# Patient Record
Sex: Female | Born: 1990 | Race: Black or African American | Hispanic: No | Marital: Single | Smoking: Former smoker
Health system: Southern US, Community
[De-identification: ages and names within clinical notes are randomized; demographics above are authoritative.]

## PROBLEM LIST (undated history)

## (undated) ENCOUNTER — Inpatient Hospital Stay (HOSPITAL_COMMUNITY): Payer: Self-pay

## (undated) DIAGNOSIS — E282 Polycystic ovarian syndrome: Secondary | ICD-10-CM

## (undated) DIAGNOSIS — R7303 Prediabetes: Secondary | ICD-10-CM

## (undated) DIAGNOSIS — Z3490 Encounter for supervision of normal pregnancy, unspecified, unspecified trimester: Secondary | ICD-10-CM

## (undated) DIAGNOSIS — R87629 Unspecified abnormal cytological findings in specimens from vagina: Secondary | ICD-10-CM

## (undated) HISTORY — DX: Polycystic ovarian syndrome: E28.2

## (undated) HISTORY — DX: Prediabetes: R73.03

## (undated) HISTORY — DX: Unspecified abnormal cytological findings in specimens from vagina: R87.629

## (undated) HISTORY — PX: NO PAST SURGERIES: SHX2092

---
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
## 2011-01-09 ENCOUNTER — Emergency Department: Payer: Self-pay | Admitting: Emergency Medicine

## 2011-05-16 ENCOUNTER — Emergency Department: Payer: Self-pay | Admitting: Emergency Medicine

## 2011-05-16 LAB — URINALYSIS, COMPLETE
Bilirubin,UR: NEGATIVE
Blood: NEGATIVE
Glucose,UR: NEGATIVE mg/dL (ref 0–75)
Ph: 5 (ref 4.5–8.0)
Protein: NEGATIVE
RBC,UR: 1 /HPF (ref 0–5)
Specific Gravity: 1.028 (ref 1.003–1.030)
Squamous Epithelial: 18
WBC UR: 1 /HPF (ref 0–5)

## 2011-05-16 LAB — WET PREP, GENITAL

## 2012-01-31 ENCOUNTER — Emergency Department (HOSPITAL_COMMUNITY)
Admission: EM | Admit: 2012-01-31 | Discharge: 2012-01-31 | Disposition: A | Discharge status: Home / Self Care | Payer: Medicaid Other | Attending: Emergency Medicine | Admitting: Emergency Medicine

## 2012-01-31 ENCOUNTER — Emergency Department (HOSPITAL_COMMUNITY): Payer: Medicaid Other

## 2012-01-31 ENCOUNTER — Encounter (HOSPITAL_COMMUNITY): Payer: Self-pay | Admitting: *Deleted

## 2012-01-31 DIAGNOSIS — Z79899 Other long term (current) drug therapy: Secondary | ICD-10-CM | POA: Insufficient documentation

## 2012-01-31 DIAGNOSIS — N760 Acute vaginitis: Secondary | ICD-10-CM | POA: Insufficient documentation

## 2012-01-31 DIAGNOSIS — R110 Nausea: Secondary | ICD-10-CM | POA: Insufficient documentation

## 2012-01-31 DIAGNOSIS — O99891 Other specified diseases and conditions complicating pregnancy: Secondary | ICD-10-CM | POA: Insufficient documentation

## 2012-01-31 DIAGNOSIS — B9689 Other specified bacterial agents as the cause of diseases classified elsewhere: Secondary | ICD-10-CM

## 2012-01-31 DIAGNOSIS — N83209 Unspecified ovarian cyst, unspecified side: Secondary | ICD-10-CM

## 2012-01-31 DIAGNOSIS — Z87891 Personal history of nicotine dependence: Secondary | ICD-10-CM | POA: Insufficient documentation

## 2012-01-31 DIAGNOSIS — Z3490 Encounter for supervision of normal pregnancy, unspecified, unspecified trimester: Secondary | ICD-10-CM

## 2012-01-31 HISTORY — DX: Encounter for supervision of normal pregnancy, unspecified, unspecified trimester: Z34.90

## 2012-01-31 LAB — URINALYSIS, ROUTINE W REFLEX MICROSCOPIC
Bilirubin Urine: NEGATIVE
Hgb urine dipstick: NEGATIVE
Specific Gravity, Urine: >1.03 — ABNORMAL HIGH (ref 1.005–1.030)
pH: 6 (ref 5.0–8.0)

## 2012-01-31 LAB — WET PREP, GENITAL
Trich, Wet Prep: NONE SEEN
Yeast Wet Prep HPF POC: NONE SEEN

## 2012-01-31 LAB — HCG, QUANTITATIVE, PREGNANCY: hCG, Beta Chain, Quant, S: 98305 m[IU]/mL — ABNORMAL HIGH (ref <5)

## 2012-01-31 MED ORDER — ACETAMINOPHEN 325 MG PO TABS
650.0000 mg | ORAL_TABLET | Freq: Once | ORAL | Status: AC
  Administered 2012-01-31: 650 mg via ORAL
  Filled 2012-01-31: qty 2

## 2012-01-31 MED ORDER — METRONIDAZOLE 500 MG PO TABS: 500.0000 mg | ORAL_TABLET | Freq: Two times a day (BID) | ORAL | Status: DC

## 2012-01-31 NOTE — ED Notes (Addendum)
Pt c/o right lower abd pain that started yesterday, pt woke up this am and advised that the pain is worse, denies any n/v/d, pt is [redacted] weeks pregnant, pregnancy confirmed by ob/gyn in [place], had ultrasound done in office with EDD of 09/10/2012. First pregnancy for pt. Pt states that she has had pain like this before and was told that she had a uti. Pt was sitting in waiting room eating bag of potato chips when called to tx room, pt advised that she is not able to eat or drink until cleared by dr.

## 2012-02-01 LAB — GC/CHLAMYDIA PROBE AMP, GENITAL: GC Probe Amp, Genital: NEGATIVE

## 2012-02-01 NOTE — ED Provider Notes (Signed)
History     CSN: [id number]  Arrival date & time 01/31/12  0900   First MD Initiated Contact with Patient 01/31/12 0915      Chief Complaint  Patient presents with  . Abdominal Pain    (Consider location/radiation/quality/duration/timing/severity/associated sxs/prior treatment) HPI Comments: Tonya Burns presents for evaluation of a 24-hour history of right low were pelvic pain which she described as cramping in character and intermittent.  Patient is currently [redacted] weeks pregnant with her first pregnancy with a confirmed intrauterine pregnancy by her gynecologist at [facility] by ultrasound one week ago.  Mother at bedside notes that there was a benign "nodule" found during this ultrasound at which time she was pain-free.  She denies vaginal bleeding, spotting or discharge and has also had no  vomiting, diarrhea or dysuria.  She does have mild morning nausea but none today, in fact presents here eating a bag of potato chips.  She has taken Tylenol, last dose yesterday evening which helped her cramping moderately.  The history is provided by the patient and a parent.    Past Medical History  Diagnosis Date  . Pregnant     History reviewed. No pertinent past surgical history.  No family history on file.  History  Substance Use Topics  . Smoking status: Former Games developer  . Smokeless tobacco: Not on file  . Alcohol Use: No    OB History    Grav Para Term Preterm Abortions TAB SAB Ect Mult Living   1               Review of Systems  Constitutional: Negative for fever.  HENT: Negative for congestion, sore throat and neck pain.   Eyes: Negative.   Respiratory: Negative for chest tightness and shortness of breath.   Cardiovascular: Negative for chest pain.  Gastrointestinal: Positive for nausea. Negative for vomiting and abdominal pain.  Genitourinary: Positive for pelvic pain. Negative for dysuria, flank pain, vaginal bleeding and vaginal discharge.  Musculoskeletal:  Negative for joint swelling and arthralgias.  Skin: Negative.  Negative for rash and wound.  Neurological: Negative for dizziness, weakness, light-headedness, numbness and headaches.  Hematological: Negative.   Psychiatric/Behavioral: Negative.     Allergies  Review of patient's allergies indicates no known allergies.  Home Medications   Current Outpatient Rx  Name Route Sig Dispense Refill  . PRENATAL MULTIVITAMIN CH Oral Take 1 tablet by mouth daily.    Marland Kitchen METRONIDAZOLE 500 MG PO TABS Oral Take 1 tablet (500 mg total) by mouth 2 (two) times daily. 14 tablet 0    BP 102/65  Pulse 79  Temp 98.5 F (36.9 C)  Resp 20  SpO2 100%  LMP 04/21/2010  Physical Exam  Nursing note and vitals reviewed. Constitutional: She appears well-developed and well-nourished.  HENT:  Head: Normocephalic and atraumatic.  Eyes: Conjunctivae normal are normal.  Neck: Normal range of motion.  Cardiovascular: Normal rate, regular rhythm, normal heart sounds and intact distal pulses.   Pulmonary/Chest: Effort normal and breath sounds normal. She has no wheezes.  Abdominal: Soft. Bowel sounds are normal. She exhibits no distension. There is tenderness in the right lower quadrant. There is no rebound and no guarding.    Genitourinary: Uterus is enlarged. Uterus is not tender. Cervix exhibits no motion tenderness, no discharge and no friability. Right adnexum displays tenderness. No erythema or tenderness around the vagina. Vaginal discharge found.       Nulliparous, closed with mucus plug.  Copious white thick discharge.  Musculoskeletal: Normal range of motion.  Neurological: She is alert.  Skin: Skin is warm and dry.  Psychiatric: She has a normal mood and affect.    ED Course  Procedures (including critical care time)  Labs Reviewed  URINALYSIS, ROUTINE W REFLEX MICROSCOPIC - Abnormal; Notable for the following:    Specific Gravity, Urine >1.030 (*)     All other components within normal limits   PREGNANCY, URINE - Abnormal; Notable for the following:    Preg Test, Ur POSITIVE (*)     All other components within normal limits  HCG, QUANTITATIVE, PREGNANCY - Abnormal; Notable for the following:    hCG, Beta Chain, Quant, S [id number] (*)     All other components within normal limits  WET PREP, GENITAL - Abnormal; Notable for the following:    Clue Cells Wet Prep HPF POC MANY (*)     WBC, Wet Prep HPF POC FEW (*)     All other components within normal limits  GC/CHLAMYDIA PROBE AMP, GENITAL  LAB REPORT - SCANNED   US Ob Comp Less 14 Wks  01/31/2012  *RADIOLOGY REPORT*  Clinical Data: Abdominal and pelvic pain.  Irregular menses. Uncertain LMP.  OBSTETRIC <14 WK [place] AND TRANSVAGINAL OB US  Technique:  Both transabdominal and transvaginal ultrasound examinations were performed for complete evaluation of the gestation as well as the maternal uterus, adnexal regions, and pelvic cul-de-sac.  Transvaginal technique was performed to assess early pregnancy.  Comparison:  None.  Intrauterine gestational sac:  Visualized/normal in shape. Yolk sac: Visualized Embryo: Visualized Cardiac Activity: Visualized Heart Rate: 173  bpm  CRL: 24  mm  9 w  0 d         [place] EDC: 09/04/2012  Maternal uterus/adnexae: The left ovary is normal appearance.  A simple right ovarian cyst is seen measuring 3.5 cm, consistent with a corpus luteum.  No adnexal mass identified.  Trace amount of free fluid seen and pelvic cul-de-sac.  IMPRESSION:  1.  Single living IUP measuring 9 weeks 0 days with [place] EDC of 09/04/2012. 2.  3.5 cm right ovarian corpus luteum cyst.  No adnexal mass identified.   Original Report Authenticated By: Danae Orleans, M.D.    US Ob Transvaginal  01/31/2012  *RADIOLOGY REPORT*  Clinical Data: Abdominal and pelvic pain.  Irregular menses. Uncertain LMP.  OBSTETRIC <14 WK [place] AND TRANSVAGINAL OB US  Technique:  Both transabdominal and transvaginal ultrasound examinations were performed for complete evaluation of  the gestation as well as the maternal uterus, adnexal regions, and pelvic cul-de-sac.  Transvaginal technique was performed to assess early pregnancy.  Comparison:  None.  Intrauterine gestational sac:  Visualized/normal in shape. Yolk sac: Visualized Embryo: Visualized Cardiac Activity: Visualized Heart Rate: 173  bpm  CRL: 24  mm  9 w  0 d         [place] EDC: 09/04/2012  Maternal uterus/adnexae: The left ovary is normal appearance.  A simple right ovarian cyst is seen measuring 3.5 cm, consistent with a corpus luteum.  No adnexal mass identified.  Trace amount of free fluid seen and pelvic cul-de-sac.  IMPRESSION:  1.  Single living IUP measuring 9 weeks 0 days with [place] EDC of 09/04/2012. 2.  3.5 cm right ovarian corpus luteum cyst.  No adnexal mass identified.   Original Report Authenticated By: Danae Orleans, M.D.      1. Ovarian cyst   2. Intrauterine pregnancy   3. Bacterial vaginosis  MDM  Patients labs and/or radiological studies were reviewed during the medical decision making and disposition process.  Patient with ovarian cyst based on today's ultrasound.  Intrauterine pregnancy gestational age [redacted] weeks 0 days.  Fetal heart rate 173.  Discussed various options for pain medication, patient opted to continue with her Tylenol.  She does have an appointment with her OB/GYN in approximately 2 weeks.  She was encouraged to call for earlier followup if her symptoms persist or worsen.  Patient understands and agrees with current plan.         Burgess Amor, [place] 02/01/12 1644

## 2012-02-04 NOTE — ED Provider Notes (Signed)
Medical screening examination/treatment/procedure(s) were performed by non-physician practitioner and as supervising physician I was immediately available for consultation/collaboration.   Laray Anger, DO 02/04/12 1627

## 2012-04-10 NOTE — L&D Delivery Note (Signed)
  Abbi, Mancini [[id number]]  Delivery Note At 12:14 PM a non-viable female was delivered via Vaginal, Spontaneous Delivery (Presentation: Left Occiput Posterior).  APGAR: 2, 1; weight pending.   Placenta status: Intact, Spontaneous.  Cord: 3 vessels with the following complications: None.   Anesthesia: None  Episiotomy: None Lacerations: None Suture Repair: none Est. Blood Loss (mL):   Mom to [facility].  Baby to N/A.  Bonnita Nasuti 04/26/2012, 2:30 PM

## 2012-04-10 NOTE — L&D Delivery Note (Signed)
Attestation of Attending Supervision of Advanced Practitioner (CNM/NP): Evaluation and management procedures were performed by the Advanced Practitioner under my supervision and collaboration.  I have reviewed the Advanced Practitioner's note and chart, and I agree with the management and plan.  HARRAWAY-SMITH, Chantale Leugers 2:32 PM     

## 2012-04-26 ENCOUNTER — Encounter (HOSPITAL_COMMUNITY): Payer: Self-pay | Admitting: Emergency Medicine

## 2012-04-26 ENCOUNTER — Emergency Department (HOSPITAL_COMMUNITY)
Admission: EM | Admit: 2012-04-26 | Discharge: 2012-04-26 | Disposition: A | Discharge status: Home / Self Care | Payer: Medicaid Other | Attending: Emergency Medicine | Admitting: Emergency Medicine

## 2012-04-26 ENCOUNTER — Emergency Department (HOSPITAL_COMMUNITY): Payer: Medicaid Other

## 2012-04-26 ENCOUNTER — Inpatient Hospital Stay (HOSPITAL_COMMUNITY)
Admission: EM | Admit: 2012-04-26 | Discharge: 2012-04-27 | DRG: 775 | Disposition: A | Discharge status: Home / Self Care | Payer: Medicaid Other | Attending: Obstetrics & Gynecology | Admitting: Obstetrics & Gynecology

## 2012-04-26 DIAGNOSIS — R109 Unspecified abdominal pain: Secondary | ICD-10-CM | POA: Insufficient documentation

## 2012-04-26 DIAGNOSIS — O9989 Other specified diseases and conditions complicating pregnancy, childbirth and the puerperium: Secondary | ICD-10-CM | POA: Insufficient documentation

## 2012-04-26 DIAGNOSIS — O3430 Maternal care for cervical incompetence, unspecified trimester: Secondary | ICD-10-CM

## 2012-04-26 DIAGNOSIS — Z87891 Personal history of nicotine dependence: Secondary | ICD-10-CM | POA: Insufficient documentation

## 2012-04-26 DIAGNOSIS — K5900 Constipation, unspecified: Secondary | ICD-10-CM | POA: Insufficient documentation

## 2012-04-26 DIAGNOSIS — N898 Other specified noninflammatory disorders of vagina: Secondary | ICD-10-CM | POA: Insufficient documentation

## 2012-04-26 DIAGNOSIS — O09893 Supervision of other high risk pregnancies, third trimester: Secondary | ICD-10-CM | POA: Diagnosis present

## 2012-04-26 DIAGNOSIS — N939 Abnormal uterine and vaginal bleeding, unspecified: Secondary | ICD-10-CM

## 2012-04-26 LAB — URINALYSIS, ROUTINE W REFLEX MICROSCOPIC
Bilirubin Urine: NEGATIVE
Ketones, ur: NEGATIVE mg/dL
Protein, ur: NEGATIVE mg/dL
Urobilinogen, UA: 0.2 mg/dL (ref 0.0–1.0)

## 2012-04-26 LAB — CBC WITH DIFFERENTIAL/PLATELET
Eosinophils Absolute: 0.1 10*3/uL (ref 0.0–0.7)
Eosinophils Relative: 1 % (ref 0–5)
Lymphs Abs: 2.5 10*3/uL (ref 0.7–4.0)
MCH: 30.2 pg (ref 26.0–34.0)
MCV: 86.8 fL (ref 78.0–100.0)
Monocytes Absolute: 0.9 10*3/uL (ref 0.1–1.0)
Platelets: 189 10*3/uL (ref 150–400)
RBC: 3.71 MIL/uL — ABNORMAL LOW (ref 3.87–5.11)
RDW: 13.4 % (ref 11.5–15.5)

## 2012-04-26 LAB — BASIC METABOLIC PANEL
BUN: 10 mg/dL (ref 6–23)
Calcium: 9.3 mg/dL (ref 8.4–10.5)
Creatinine, Ser: 0.57 mg/dL (ref 0.50–1.10)
GFR calc non Af Amer: >90 mL/min (ref >90)
Glucose, Bld: 85 mg/dL (ref 70–99)
Sodium: 133 mEq/L — ABNORMAL LOW (ref 135–145)

## 2012-04-26 LAB — URINE MICROSCOPIC-ADD ON

## 2012-04-26 MED ORDER — POLYETHYLENE GLYCOL 3350 17 GM/SCOOP PO POWD: 17.0000 g | Freq: Two times a day (BID) | ORAL | Status: DC

## 2012-04-26 MED ORDER — FAMOTIDINE IN NACL 20-0.9 MG/50ML-% IV SOLN: 20.0000 mg | Freq: Once | INTRAVENOUS | Status: DC

## 2012-04-26 MED ORDER — ONDANSETRON HCL 4 MG PO TABS: 4.0000 mg | ORAL_TABLET | ORAL | Status: DC | PRN

## 2012-04-26 MED ORDER — LIDOCAINE HCL (PF) 1 % IJ SOLN: 30.0000 mL | INTRAMUSCULAR | Status: DC | PRN

## 2012-04-26 MED ORDER — OXYTOCIN BOLUS FROM INFUSION
500.0000 mL | INTRAVENOUS | Status: DC
  Administered 2012-04-26: 500 mL via INTRAVENOUS

## 2012-04-26 MED ORDER — TETANUS-DIPHTH-ACELL PERTUSSIS 5-2.5-18.5 LF-MCG/0.5 IM SUSP: 0.5000 mL | Freq: Once | INTRAMUSCULAR | Status: DC

## 2012-04-26 MED ORDER — OXYCODONE-ACETAMINOPHEN 5-325 MG PO TABS
1.0000 | ORAL_TABLET | ORAL | Status: DC | PRN
  Administered 2012-04-26 – 2012-04-27 (×2): 1 via ORAL
  Filled 2012-04-26: qty 1
  Filled 2012-04-26: qty 2

## 2012-04-26 MED ORDER — LACTATED RINGERS IV SOLN: INTRAVENOUS | Status: DC

## 2012-04-26 MED ORDER — SENNOSIDES-DOCUSATE SODIUM 8.6-50 MG PO TABS: 2.0000 | ORAL_TABLET | Freq: Every day | ORAL | Status: DC

## 2012-04-26 MED ORDER — CITRIC ACID-SODIUM CITRATE 334-500 MG/5ML PO SOLN: 30.0000 mL | ORAL | Status: DC | PRN

## 2012-04-26 MED ORDER — IBUPROFEN 600 MG PO TABS: 600.0000 mg | ORAL_TABLET | Freq: Four times a day (QID) | ORAL | Status: DC | PRN

## 2012-04-26 MED ORDER — FENTANYL CITRATE 0.05 MG/ML IJ SOLN
100.0000 ug | Freq: Once | INTRAMUSCULAR | Status: AC
  Administered 2012-04-26: 100 ug via INTRAVENOUS

## 2012-04-26 MED ORDER — LANOLIN HYDROUS EX OINT: TOPICAL_OINTMENT | CUTANEOUS | Status: DC | PRN

## 2012-04-26 MED ORDER — OXYCODONE-ACETAMINOPHEN 5-325 MG PO TABS
2.0000 | ORAL_TABLET | Freq: Once | ORAL | Status: DC
  Filled 2012-04-26: qty 2

## 2012-04-26 MED ORDER — FLEET ENEMA 7-19 GM/118ML RE ENEM: 1.0000 | ENEMA | RECTAL | Status: DC | PRN

## 2012-04-26 MED ORDER — SODIUM CHLORIDE 0.9 % IV BOLUS (SEPSIS)
1000.0000 mL | Freq: Once | INTRAVENOUS | Status: AC
  Administered 2012-04-26: 1000 mL via INTRAVENOUS

## 2012-04-26 MED ORDER — TERBUTALINE SULFATE 1 MG/ML IJ SOLN
INTRAMUSCULAR | Status: AC
  Filled 2012-04-26: qty 1

## 2012-04-26 MED ORDER — BENZOCAINE-MENTHOL 20-0.5 % EX AERO: 1.0000 "application " | INHALATION_SPRAY | CUTANEOUS | Status: DC | PRN

## 2012-04-26 MED ORDER — GI COCKTAIL ~~LOC~~: 30.0000 mL | Freq: Once | ORAL | Status: DC

## 2012-04-26 MED ORDER — WITCH HAZEL-GLYCERIN EX PADS: 1.0000 "application " | MEDICATED_PAD | CUTANEOUS | Status: DC | PRN

## 2012-04-26 MED ORDER — FENTANYL CITRATE 0.05 MG/ML IJ SOLN
INTRAMUSCULAR | Status: AC
  Filled 2012-04-26: qty 2

## 2012-04-26 MED ORDER — ACETAMINOPHEN 325 MG PO TABS: 650.0000 mg | ORAL_TABLET | ORAL | Status: DC | PRN

## 2012-04-26 MED ORDER — PRENATAL MULTIVITAMIN CH
1.0000 | ORAL_TABLET | Freq: Every day | ORAL | Status: DC
  Administered 2012-04-26: 1 via ORAL
  Filled 2012-04-26: qty 1

## 2012-04-26 MED ORDER — MORPHINE SULFATE 4 MG/ML IJ SOLN
4.0000 mg | Freq: Once | INTRAMUSCULAR | Status: AC
  Administered 2012-04-26: 4 mg via INTRAVENOUS
  Filled 2012-04-26: qty 1

## 2012-04-26 MED ORDER — SIMETHICONE 80 MG PO CHEW: 80.0000 mg | CHEWABLE_TABLET | ORAL | Status: DC | PRN

## 2012-04-26 MED ORDER — OXYTOCIN 40 UNITS IN LACTATED RINGERS INFUSION - SIMPLE MED
62.5000 mL/h | INTRAVENOUS | Status: DC
  Administered 2012-04-26: 62.5 mL/h via INTRAVENOUS
  Filled 2012-04-26: qty 1000

## 2012-04-26 MED ORDER — DIBUCAINE 1 % RE OINT: 1.0000 "application " | TOPICAL_OINTMENT | RECTAL | Status: DC | PRN

## 2012-04-26 MED ORDER — TERBUTALINE SULFATE 1 MG/ML IJ SOLN
0.2500 mg | Freq: Once | INTRAMUSCULAR | Status: AC
  Administered 2012-04-26: 0.25 mg via SUBCUTANEOUS

## 2012-04-26 MED ORDER — DOCUSATE SODIUM 100 MG PO CAPS: 100.0000 mg | ORAL_CAPSULE | Freq: Two times a day (BID) | ORAL | Status: DC

## 2012-04-26 MED ORDER — DIPHENHYDRAMINE HCL 25 MG PO CAPS: 25.0000 mg | ORAL_CAPSULE | Freq: Four times a day (QID) | ORAL | Status: DC | PRN

## 2012-04-26 MED ORDER — SODIUM CHLORIDE 0.9 % IV SOLN: INTRAVENOUS | Status: DC

## 2012-04-26 MED ORDER — ONDANSETRON HCL 4 MG/2ML IJ SOLN: 4.0000 mg | Freq: Four times a day (QID) | INTRAMUSCULAR | Status: DC | PRN

## 2012-04-26 MED ORDER — IBUPROFEN 600 MG PO TABS
600.0000 mg | ORAL_TABLET | Freq: Four times a day (QID) | ORAL | Status: DC
  Administered 2012-04-26 – 2012-04-27 (×3): 600 mg via ORAL
  Filled 2012-04-26 (×3): qty 1

## 2012-04-26 MED ORDER — ONDANSETRON HCL 4 MG/2ML IJ SOLN: 4.0000 mg | INTRAMUSCULAR | Status: DC | PRN

## 2012-04-26 MED ORDER — OXYCODONE-ACETAMINOPHEN 5-325 MG PO TABS: 1.0000 | ORAL_TABLET | ORAL | Status: DC | PRN

## 2012-04-26 MED ORDER — ACETAMINOPHEN 500 MG PO TABS
1000.0000 mg | ORAL_TABLET | Freq: Once | ORAL | Status: AC
  Administered 2012-04-26: 1000 mg via ORAL
  Filled 2012-04-26: qty 2

## 2012-04-26 MED ORDER — ZOLPIDEM TARTRATE 5 MG PO TABS: 5.0000 mg | ORAL_TABLET | Freq: Every evening | ORAL | Status: DC | PRN

## 2012-04-26 MED ORDER — MAGNESIUM SULFATE 40 G IN LACTATED RINGERS - SIMPLE
2.0000 g/h | INTRAVENOUS | Status: DC
  Administered 2012-04-26: 2 g/h via INTRAVENOUS
  Filled 2012-04-26: qty 500

## 2012-04-26 MED ORDER — FENTANYL CITRATE 0.05 MG/ML IJ SOLN
INTRAMUSCULAR | Status: AC
  Administered 2012-04-26: 100 ug via INTRAVENOUS
  Filled 2012-04-26: qty 2

## 2012-04-26 MED ORDER — MAGNESIUM SULFATE 40 MG/ML IJ SOLN
4.0000 g | Freq: Once | INTRAMUSCULAR | Status: AC
  Administered 2012-04-26: 4 g via INTRAVENOUS
  Filled 2012-04-26: qty 50

## 2012-04-26 MED ORDER — LACTATED RINGERS IV SOLN: 500.0000 mL | INTRAVENOUS | Status: DC | PRN

## 2012-04-26 MED ORDER — PROMETHAZINE HCL 25 MG/ML IJ SOLN
12.5000 mg | Freq: Once | INTRAMUSCULAR | Status: AC
  Administered 2012-04-26: 12.5 mg via INTRAVENOUS
  Filled 2012-04-26: qty 1

## 2012-04-26 NOTE — ED Notes (Signed)
Report given to [facility] with [facility]. ETA to ED is 30 minutes. Dr Emelda Fear aware.

## 2012-04-26 NOTE — ED Notes (Signed)
Notified of patients arrival at [facility] ed with c/o rectal pain and vaginal bleeding.  EFM with + fht of 158 Assessing for cxt

## 2012-04-26 NOTE — ED Provider Notes (Addendum)
History     CSN: [id number]  Arrival date & time 04/26/12  0103   None     Chief Complaint  Patient presents with  . Abdominal Pain    (Consider location/radiation/quality/duration/timing/severity/associated sxs/prior treatment) HPI Comments: The pt is a 22 y/o female G2P1 who is [redacted] weeks pregnant by US performed at [facility] who presents with lower abdominal pain and lower back pain that is coming in waves and was associated with a small amount of BRB when she wiped after the BM - it started early this evening when she was having a BM, seems to be pressure and a sharp and stabbing pain in the vagina.  Nothing seems to make the pain better or worse - comes on and goes away spontaneously.She has no complications with her pregnancy at this point.  Her first pregnancy ended less than [redacted] weeks along in spontaneous miscarriage.  She was at her OBGYN earlier today for glucose testing.  She has not had pains like this thus far in the pregnancy.  She has no hx of abdominal surgery.  The pt also states that she has had some straining and very hard and rare stools over last few weeks.  No laxatives prior to arrival.  Patient is a 22 y.o. female presenting with abdominal pain. The history is provided by the patient.  Abdominal Pain The primary symptoms of the illness include abdominal pain.    Past Medical History  Diagnosis Date  . Pregnant     History reviewed. No pertinent past surgical history.  History reviewed. No pertinent family history.  History  Substance Use Topics  . Smoking status: Former Games developer  . Smokeless tobacco: Not on file  . Alcohol Use: No    OB History    Grav Para Term Preterm Abortions TAB SAB Ect Mult Living   1               Review of Systems  Gastrointestinal: Positive for abdominal pain.  All other systems reviewed and are negative.    Allergies  Review of patient's allergies indicates no known allergies.  Home Medications   Current Outpatient  Rx  Name  Route  Sig  Dispense  Refill  . DOCUSATE SODIUM 100 MG PO CAPS   Oral   Take 1 capsule (100 mg total) by mouth every 12 (twelve) hours.   30 capsule   0   . METRONIDAZOLE 500 MG PO TABS   Oral   Take 1 tablet (500 mg total) by mouth 2 (two) times daily.   14 tablet   0   . POLYETHYLENE GLYCOL 3350 PO POWD   Oral   Take 17 g by mouth 2 (two) times daily.   255 g   0   . PRENATAL MULTIVITAMIN CH   Oral   Take 1 tablet by mouth daily.           BP 115/63  Pulse 88  Temp 98.6 F (37 C) (Oral)  Resp 18  Ht 5\' 2"  (1.575 m)  Wt 147 lb (66.679 kg)  BMI 26.89 kg/m2  SpO2 100%  LMP 04/21/2010  Physical Exam  Constitutional: She appears well-developed and well-nourished. No distress.  HENT:  Head: Normocephalic and atraumatic.  Mouth/Throat: No oropharyngeal exudate.  Eyes: Conjunctivae normal are normal. Right eye exhibits no discharge. Left eye exhibits no discharge. No scleral icterus.  Cardiovascular: Normal rate, regular rhythm, normal heart sounds and intact distal pulses.   No murmur heard. Pulmonary/Chest:  Effort normal and breath sounds normal. No respiratory distress. She has no wheezes. She has no rales.  Abdominal: Soft. There is no tenderness.       Gravid uterus at level of the umbilicus (just above)  Genitourinary:       Chaperone present for vaginal exam - no bleeding, no d/c, no fluids in vaginal vault  Musculoskeletal: Normal range of motion. She exhibits no edema and no tenderness.  Neurological: She is alert. No cranial nerve deficit.  Skin: Skin is warm and dry. No rash noted. No erythema.    ED Course  Procedures (including critical care time)  Labs Reviewed  CBC WITH DIFFERENTIAL - Abnormal; Notable for the following:    WBC 12.0 (*)     RBC 3.71 (*)     Hemoglobin 11.2 (*)     HCT 32.2 (*)     Neutro Abs 8.5 (*)     All other components within normal limits  BASIC METABOLIC PANEL - Abnormal; Notable for the following:     Sodium 133 (*)     Potassium 3.2 (*)     All other components within normal limits  URINALYSIS, ROUTINE W REFLEX MICROSCOPIC - Abnormal; Notable for the following:    Hgb urine dipstick LARGE (*)     All other components within normal limits  URINE MICROSCOPIC-ADD ON - Abnormal; Notable for the following:    Squamous Epithelial / LPF MANY (*)     Bacteria, UA FEW (*)     All other components within normal limits  URINE CULTURE   No results found.   1. Abdominal pain   2. Constipation   3. Vaginal bleeding       MDM  At this time the pt has fluctuating pains, consider possible premature labor and at this point the baby is non viable.  Will place on toco monitor, VS normal, check UA and labs, pain meds IV.  I recommended to the pt that she had a pelvic exam with a speculum, she declined after discussion regarding its importance - stating that she preferred a female provider - as I was the only provider there, she declined.  She allowed examination of the external genitalia at the introitus with displacing the labia laterally - there was no blood or d/c or fluids seen coming from the vagina.  No contractions seen per [facility] hospital nurse - has improved sx after meds, labs show slight leukocytosis but no other worrisome findings - she has no sig urinary findings - hematuria likely related to her vaginal bleeding.  VS remain normal - I have recommended that the pt f/u with her OBGYN this week.  On repeat exam she has no ttp of the abd.  She will be given laxative for her constipation.  Pt appears stable at this time.  Findings explained to patient - understanding expressed  I personally performed a focused bedside US of the abdomen / good fetal movements, normal FHT's at 150 bpm.  Vida Roller, MD 04/26/12 1610  Vida Roller, MD 04/30/12 [id number]

## 2012-04-26 NOTE — ED Notes (Signed)
Patient left ED at this time with [facility]. 

## 2012-04-26 NOTE — ED Notes (Signed)
Patient placed on appropriate OB monitoring and Rapid response RN called by Diamantina Monks, RN for monitoring.

## 2012-04-26 NOTE — ED Notes (Signed)
Spoke with Dr. Emelda Fear via phone Cervical exam 4 cm with bulging bag Estimated gestation via ultrasound 22 weeks. Patient to be [facility] to [facility] for further evaluation/possible delivery.

## 2012-04-26 NOTE — ED Notes (Signed)
Patient c/o abdominal pain and vaginal pain. Seen last night for same left at 0300 per family. Patient now reporting vaginal bleeding. [redacted] weeks pregnant per chart. Patient moaning.

## 2012-04-26 NOTE — ED Notes (Signed)
PA at bedside.

## 2012-04-26 NOTE — ED Provider Notes (Signed)
Medical screening examination/treatment/procedure(s) were performed by non-physician practitioner and as supervising physician I was immediately available for consultation/collaboration. Devoria Albe, MD, Armando Gang   Ward Givens, MD 04/26/12 [id number]

## 2012-04-26 NOTE — ED Notes (Signed)
Patient will not answer questions.

## 2012-04-26 NOTE — Progress Notes (Signed)
Patient ID: Tonya Burns, female   DOB: 23-Mar-1991, 22 y.o.   MRN: [id number]  Having pain with contractions  FHR doppered 150s UCs every 2-3 mnutes  Cervix Completely dilated, bag protruding to introitus  Abdomen tender  Discussed with Dr Erin Fulling  Will stop Magnesium Sulfate and prepare for delivery  Will culture placenta after delivery

## 2012-04-26 NOTE — H&P (Signed)
Tonya Burns is 22 y.o. G1P0 at at 21+ weeks by patient report, dates based on u/s done at [facility] last week, who presents with preterm labor. She was late to [facility] at 17 weeks, and has since had care at [facility] department. She has been feeling regular contractions since early this morning, and some vaginal bleeding last night. She has not noted any LOF.  She presented to the Emergency room early this morning with complaints of abdominal pain and vaginal bleeding, and no contractions were noted, no blood in the vaginal vault, no dilation, and she was discharged home. A few hours later, she returned to the ED with similar complaints, and was found to be 4 cm dilated with a bulging bag , and was sent to [facility] due to active pre-term labor.    Maternal Medical History:  Reason for admission: Reason for admission: contractions.  Reason for Admission:   nauseaContractions: Frequency: regular.   Perceived severity is strong.    Prenatal complications: Preterm labor.     OB History    Grav Para Term Preterm Abortions TAB SAB Ect Mult Living   1              Past Medical History  Diagnosis Date  . Pregnant    History reviewed. No pertinent past surgical history. Family History: family history is not on file. Social History:  reports that she has quit smoking. She does not have any smokeless tobacco history on file. She reports that she does not drink alcohol or use illicit drugs.   Prenatal Transfer Tool  Maternal Diabetes: One hour GTT 144, three hour not yet done Genetic Screening: Declined Maternal Ultrasounds/Referrals: Normal Fetal Ultrasounds or other Referrals:  None Maternal Substance Abuse:  No Significant Maternal Medications:  None Significant Maternal Lab Results:  None Other Comments:  None  Review of Systems  Constitutional: Negative for fever and chills.  Eyes: Negative for blurred vision and double vision.  Respiratory: Negative for cough.     Cardiovascular: Negative for chest pain.  Gastrointestinal: Positive for abdominal pain. Negative for nausea and vomiting.  Neurological: Negative for headaches.    Dilation: 10 Exam by:: Artelia Laroche, CNM Blood pressure 115/54, pulse 140, temperature 98.6 F (37 C), temperature source Oral, resp. rate 24, height 5\' 2"  (1.575 m), weight 66.679 kg (147 lb), last menstrual period 04/21/2010, SpO2 87.00%. Maternal Exam:  Uterine Assessment: Contraction strength is moderate.  Contraction frequency is regular.   Abdomen: Patient reports generalized tenderness.    Fetal Exam Fetal Monitor Review: Mode: hand-held doppler probe.   Baseline rate: 150.      Physical Exam  Constitutional: She appears well-developed and well-nourished. She appears distressed.  HENT:  Head: Normocephalic and atraumatic.  Eyes: Pupils are equal, round, and reactive to light.  Neck: Normal range of motion. Neck supple.  Cardiovascular: Normal rate, regular rhythm, normal heart sounds and intact distal pulses.   No murmur heard. Respiratory: Effort normal and breath sounds normal. No respiratory distress.  GI: There is generalized tenderness. There is guarding.       Gravid  Musculoskeletal: She exhibits no edema and no tenderness.  Neurological: She is alert.  Skin: Skin is warm and dry.  Psychiatric:       Quiet and appropriate regarding situation    Prenatal labs: ABO, Rh:  A+ Antibody:  neg  Rubella:  imm RPR:   NR HBsAg:   neg HIV:   neg GBS:  unknown  Assessment/Plan: 22 y.o. G1P0 at 21 weeks who presents with preterm labor - admit to L&D - Will stop Magnesium  - fentanyl for pain control - Anticipate SVD   CONROY, LOUISA 04/26/2012, 11:34 AM  Seen and agree with note Patient made aware of advanced dilation and grim prognosis for this probably nonviable baby.  Wynelle Bourgeois CNM

## 2012-04-26 NOTE — ED Provider Notes (Signed)
History     CSN: [id number]  Arrival date & time 04/26/12  8119   First MD Initiated Contact with Patient 04/26/12 [phone]      Chief Complaint  Patient presents with  . Abdominal Pain    (Consider location/radiation/quality/duration/timing/severity/associated sxs/prior treatment) HPI Comments:  22 year old female who is [redacted] weeks pregnant presents to the emergency department complaining of abdominal pain, vaginal pain and back pain beginning yesterday. She was seen last night for the same and discharged around 3 clock this morning. Admits to vaginal bleeding with bright red blood. Exam overnight was unremarkable for any acute problem. There was no vaginal bleeding, discharge or fluids in her vaginal vault at that time. No contractions were seen at that time per nurse at [facility] hospital. She was discharged and given laxatives for constipation. Her pain worsened throughout the morning and is becoming going since. States "I have pressure in my butt". She has a history of a spontaneous abortion in the past at 6 weeks.  The history is provided by the patient. The history is limited by the condition of the patient.    Past Medical History  Diagnosis Date  . Pregnant     History reviewed. No pertinent past surgical history.  No family history on file.  History  Substance Use Topics  . Smoking status: Former Games developer  . Smokeless tobacco: Not on file  . Alcohol Use: No    OB History    Grav Para Term Preterm Abortions TAB SAB Ect Mult Living   1               Review of Systems  Unable to perform ROS: Other  Genitourinary: Positive for vaginal bleeding, vaginal pain and pelvic pain.  Musculoskeletal: Positive for back pain.    Allergies  Review of patient's allergies indicates no known allergies.  Home Medications   Current Outpatient Rx  Name  Route  Sig  Dispense  Refill  . DOCUSATE SODIUM 100 MG PO CAPS   Oral   Take 1 capsule (100 mg total) by mouth every 12 (twelve)  hours.   30 capsule   0   . METRONIDAZOLE 500 MG PO TABS   Oral   Take 1 tablet (500 mg total) by mouth 2 (two) times daily.   14 tablet   0   . POLYETHYLENE GLYCOL 3350 PO POWD   Oral   Take 17 g by mouth 2 (two) times daily.   255 g   0   . PRENATAL MULTIVITAMIN CH   Oral   Take 1 tablet by mouth daily.           BP 95/65  Pulse 95  Temp 99.4 F (37.4 C) (Oral)  Resp 24  Wt 150 lb (68.04 kg)  SpO2 100%  LMP 04/21/2010  Physical Exam  Nursing note and vitals reviewed. Constitutional: She is oriented to person, place, and time. She appears well-developed and well-nourished. She appears distressed.       On fetal monitor.  HENT:  Head: Normocephalic and atraumatic.  Eyes: Conjunctivae normal are normal.  Neck: Normal range of motion. Neck supple.  Cardiovascular: Regular rhythm, normal heart sounds and intact distal pulses.  Tachycardia present.   Pulmonary/Chest: Breath sounds normal. Tachypnea noted. She has no decreased breath sounds.  Abdominal: There is generalized tenderness.  Genitourinary:       Bulging blue sac in vaginal opening.  Musculoskeletal: Normal range of motion.  Neurological: She is alert and oriented to  person, place, and time.  Skin: Skin is warm and dry.  Psychiatric: Her mood appears anxious.    ED Course  Procedures (including critical care time)  Labs Reviewed - No data to display US Ob Limited  04/26/2012  *RADIOLOGY REPORT*  Clinical Data:  Preterm labor  LIMITED OBSTETRIC ULTRASOUND  Number of Fetuses: 1 Heart Rate: 143bpm Movement:  Present Presentation: Cephalic Placental Location: Posterior right lateral Previa: None Amniotic Fluid (Subjective): Normal  BPD:  5.34cm   22w    2d       EDC: 08/29/2012  MATERNAL FINDINGS: Cervix:  Open 3.8 cm with protruding membrane/sac into the vagina Uterus/Adnexae:  Uterine contractions identified.  Limited adnexal assessment.  IMPRESSION: Single live intrauterine gestation measured at 22 weeks  2 days EGA by BPD. Cervix open 3.8 cm diameter with protruding membranes and sac into vagina. Posterior right lateral placenta without previa.  This exam is performed on an emergent basis and does not comprehensively evaluate fetal size, dating, or anatomy, and a follow-up complete OB US should be considered if further fetal assessment is warranted.   Original Report Authenticated By: Ulyses Southward, M.D.      1. Preterm labor       MDM  22 y/o female in pre-term labor. Bedside US shows 4 cm dilation. 22 week pregnancy. Dr. Emelda Fear called in and will ride ambulance with patient down to [facility] for delivery. Preparations for delivery in ED in case delivery takes place prior to being transported. Dr. Lynelle Doctor present.    CRITICAL CARE Performed by: Johnnette Gourd   Total critical care time: 1 hour  Critical care time was exclusive of separately billable procedures and treating other patients.  Critical care was necessary to treat or prevent imminent or life-threatening deterioration.  Critical care was time spent personally by me on the following activities: development of treatment plan with patient and/or surrogate as well as nursing, discussions with consultants, evaluation of patient's response to treatment, examination of patient, obtaining history from patient or surrogate, ordering and performing treatments and interventions, ordering and review of laboratory studies, ordering and review of radiographic studies, pulse oximetry and re-evaluation of patient's condition.        Trevor Mace, PA-C 04/26/12 1016

## 2012-04-26 NOTE — ED Provider Notes (Signed)
21 yr G1 P0 at 21+ weeks by patient report, based on u/s done at [facility] last week, presents with advanced preterm labor with bulging BOW nearly to introitus based on speculum check by Dr Lynelle Doctor, with ultrasound here showing a dilated cervix to 4 cm, with vertex presentation behind cervix, with BPD and FL consistent with 22+ weeks on bedside u/s. Posterior placenta noted. Patient has been given terbutalene 250 mcg, and begun on Mg Sulfate 4 gm load dose in case records, when received indicate pt is possibly viable age.  Labor stabilized.  Will transport via [facility] to [facility].  Dr Erin Fulling informed of pt and condition.

## 2012-04-26 NOTE — ED Notes (Signed)
Patient complaining of lower abdominal pressure and aching lower back pain. Reports has had issues with constipation, was attempting to have bowel movement and noticed some spotting of blood. Also reports having sharp pain in vagina. Reports is [redacted] weeks pregnant.

## 2012-04-26 NOTE — Progress Notes (Signed)
Patient ID: Tonya Burns, female   DOB: 1990/12/04, 22 y.o.   MRN: [id number] 21 you G2P0020 s/p delivery at 21-22 weeks of a nonviable female infant.  Pts spouse was very upset and requested a meeting with the physician.  I met with him.  Present were 2 L&D nurses, the midwife, a PA student and a resident the patient was present and gave me permission to speak to her family which included her spouse, his aunt, her mother and her partners grandmother.  Her partner expressed concerns that the pt delivered and he didn't know that delivery was imminent.   He expressed concerns that the pt was sent home from the prev hosp.  He was concerned about future fertility and prevention of future poor outcomes.  I reviewed with him the information that I received prior to transfer.  I explained that the pt was transfered so that if there was any chance of resuscitation the baby would have the best chance for survival.  I attempted to answer all of his questions to the best of my ability.  I explained to him that the delivery could be the result of infection of cervical insufficiency.  We have obtained cx and will f/u on those. I recommended that her cervical length be checked in any subsequent pregnancy.  They are undecided regarding the desire for further fetal testing.  The spouse left the room and the mother and other family members expressed understanding of the patients current clinical situation.   Cadell Gabrielson L. Harraway-Smith, M.D., Evern Core

## 2012-04-26 NOTE — Progress Notes (Signed)
Pt was resting when I went to check on her and FOB was unavailable.  I will refer to chaplain who is on-call over the weekend, but please page as needs arise.  [employer] Pager, [phone] 4:10 PM   04/26/12 1600  Clinical Encounter Type  Visited With Patient not available;Health care provider  Stress Factors  Patient Stress Factors Loss

## 2012-04-26 NOTE — ED Notes (Signed)
EDD 09/10/12

## 2012-04-26 NOTE — ED Provider Notes (Signed)
Pt is G2P0Ab1 (miscarriage at 6 weeks)  states she is [redacted] weeks pregnant relates she has been having intermittant abdominal cramping since last night.  Pt is crying, in distress. On speculum exam she has a bulging blue sac just inside the vaginal opening.   09:00 Dr Emelda Fear will come see patient, wants bedside US to verify fetal age  Bedside US shows sac in the vagina, head at the cervical opening, cx dilated 4 cm, +FHR 144, BPD gives fetal age of 22 weeks  Preparations made for possible delivery. Delivery tray at bedside. The infant warmer was set up, equipment set up for possible intubation and suction of infant.  09:14 Dr Emelda Fear at bedside  Neonatal team coming to transport patient and possibly fetus. Dr Emelda Fear plans on riding in the ambulance with patient.   Labor/Contractions ceased after getting terbutaline/magnesium. Pt transported by [facility] and the Neonatal ambulance is meeting them and following them to [facility].     Medical screening examination/treatment/procedure(s) were conducted as a shared visit with non-physician practitioner(s) and myself.  I personally evaluated the patient during the encounter  Devoria Albe, MD, Franz Dell, MD 04/26/12 1022

## 2012-04-26 NOTE — Progress Notes (Addendum)
Visit on referral from day time Chaplain. Spoke briefly with Tonya Burns, who said she had cried all day and is tired of crying. She was not interested in talking about her child's death. She watched television to avoid conversation with both Chaplain and her significant other Thereasa Distance). Her declaration that she is tired of crying was clarified to say she is tired of thinking about her son's death. She said this is her first pregnancy and the realization that her child died when all evidence pointed to a successful birth, is overwhelming. She mentioned that her support circle is her mother and relatives.  Thereasa Distance, the significant other is deeply in grief. He questions Tonya Gordy's care in and why Tonya Brackin went into labor so early. He is deeply religious, and when time came to pray he asked for prayers to invoked God to "cure the problem and assist in [place] getting pregnant again." Tonya Carico winced at that suggestion. It is evident that both Tonya Spano and her significant other are so deeply in grief that things told to them about this death have been said but not heard. They are in shock.  Prayer and grief counsel given. Tonya Bedwell confirmed that her negative feelings against medical and spiritual care givers stems from our inability to answer the question, "Why?"  Follow up counsel and referrals are needed.  Chaplains are available 24/7 should follow up be required. Please page if we can assist in any way.   Benjie Karvonen. Kamesha Herne, D.Min., APC Chaplain

## 2012-04-26 NOTE — ED Notes (Signed)
[facility] at bedside for patient transport. Patient belongings given to family member.

## 2012-04-26 NOTE — ED Notes (Signed)
Notified by [facility] Rn that patients cervix is complete with bulging bag. Dr. Emelda Fear notified and in route to [facility] ED

## 2012-04-26 NOTE — Consult Note (Addendum)
Called to evaluate a baby being born estimated to be 21-22 wks. Vaginal delivery. Fetus has very transparent and gelatinous skin, visible veins, very small mouth and fused eyelids. Physical features consistent with 21-[redacted] weeks gestation which is previable.   Weight is pending.  I spoke to parents separately and explained that this is too early for survival and that there is no treatment indicated and offered comfort measures. Mom held the baby.  Tonya Burns Q  BW 394 gms.  Tonya Burns Q

## 2012-04-26 NOTE — ED Notes (Signed)
Report given to [facility]. ETA to ED 20 minutes.

## 2012-04-26 NOTE — ED Notes (Signed)
Dr Emelda Fear at bedside. NICU team requested.

## 2012-04-27 ENCOUNTER — Encounter (HOSPITAL_COMMUNITY): Payer: Self-pay | Admitting: *Deleted

## 2012-04-27 ENCOUNTER — Telehealth (HOSPITAL_COMMUNITY): Payer: Self-pay | Admitting: Emergency Medicine

## 2012-04-27 DIAGNOSIS — O09893 Supervision of other high risk pregnancies, third trimester: Secondary | ICD-10-CM | POA: Diagnosis present

## 2012-04-27 MED ORDER — IBUPROFEN 600 MG PO TABS: 600.0000 mg | ORAL_TABLET | Freq: Four times a day (QID) | ORAL | Status: DC | PRN

## 2012-04-27 NOTE — Progress Notes (Signed)
Discussed with patient and significant other Thereasa Distance) the need for arrangements to care for baby's body.  Patient held no eye contact nor verbalized any opinion.  The significant other Thereasa Distance) stated that we (meaning the hospital) could care of the baby. Explained to patient and significant other the need for arrangements.  The significant other stated that they would just abandon the baby.  Dr. Emelda Fear made aware and in to talk with patient and significant other.

## 2012-04-27 NOTE — Progress Notes (Signed)
Patient Verbalized to RN that the funeral home will be [facility] and that she had spoke with them and had released the baby's body to  [employer].  Discharge instructions reviewed with patient and significant other.  Discussed activity, signs/symptoms to report to MD or seek medical help, community resources, depression and follow up visit with MD.  Ambulated to car with staff without incident and discharged home with family.  Patient and significant other stated understanding of home care.

## 2012-04-27 NOTE — Discharge Summary (Addendum)
Obstetric Discharge Summary Reason for Admission: Preterm labor at 22 weeks with advanced cervical dilation Prenatal Procedures: none Intrapartum Procedures: spontaneous vaginal delivery Postpartum Procedures: none Complications-Operative and Postpartum: none Hemoglobin  Date Value Range Status  04/26/2012 11.2* 12.0 - 15.0 g/dL Final     HCT  Date Value Range Status  04/26/2012 32.2* 36.0 - 46.0 % Final    Physical Exam:  General: alert, cooperative and no distress Lochia: appropriate Uterine Fundus: firm Incision: n/a DVT Evaluation: No evidence of DVT seen on physical exam. Negative Homan's sign.  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 Schedule an appointment as soon as possible for a visit in 2 weeks.   Contact information:   [address] [place] [address] [phone]          Newborn Data:   Summerlyn, Fickel [[id number]]  Live born female  Birth Weight: 13.9 oz (394 g) APGAR: 2, 1   Baili, Stang [[id number]]  Live born female  Birth Weight:  APGAR: ,    22 week infant born with demise shortly after delivery.. Pt will need early prenatal care with next pregnancy Patient is a candidate for 17-P Possible candidate for cerclage, but will definitely need cervical length assessment in early 2nd trimester.  Hermenia Fritcher H. 04/27/2012, 10:36 AM

## 2012-04-27 NOTE — Progress Notes (Signed)
Provided patient with funeral home list.  Social worker unavailable until 1230 p.m.Tonya Burns  Patient declined to see SW.  Patient receptive of funeral home list.

## 2012-04-29 LAB — WOUND CULTURE: Culture: NO GROWTH

## 2012-04-29 LAB — URINE CULTURE: Colony Count: 45000

## 2012-04-29 MED FILL — Medication: Qty: 1 | Status: AC

## 2012-04-29 NOTE — H&P (Signed)
Attestation of Attending Supervision of Advanced Practitioner: Evaluation and management procedures were performed by the PA/NP/CNM/OB Fellow under my supervision/collaboration. Chart reviewed and agree with management and plan. I saw the patient at [facility] ED and coordinated transfer to [facility] hospital for delivery.  Teran Daughenbaugh V 04/29/2012 10:42 PM

## 2012-04-29 NOTE — Progress Notes (Signed)
Post discharge ur chart review completed. 

## 2012-04-30 ENCOUNTER — Telehealth (HOSPITAL_COMMUNITY): Payer: Self-pay | Admitting: Emergency Medicine

## 2012-04-30 LAB — WOUND CULTURE: Gram Stain: NONE SEEN

## 2012-04-30 NOTE — ED Notes (Signed)
+   Urine Chart sent to EDP office for review. 

## 2012-05-01 LAB — ANAEROBIC CULTURE
Special Requests: NORMAL
Special Requests: NORMAL

## 2012-05-01 NOTE — ED Notes (Signed)
Chart sent to EDP office for review. Rx for Keflex 500 BID x 7 days written by Jonathon Jordan needs to be called to pharmacy.

## 2012-05-04 ENCOUNTER — Telehealth (HOSPITAL_COMMUNITY): Payer: Self-pay | Admitting: Emergency Medicine

## 2012-05-05 NOTE — ED Notes (Signed)
Unable to contact patient via phone. Sent letter. °

## 2012-05-31 ENCOUNTER — Emergency Department: Payer: Self-pay | Admitting: Emergency Medicine

## 2012-05-31 LAB — URINALYSIS, COMPLETE
Bacteria: NONE SEEN
Bilirubin,UR: NEGATIVE
Glucose,UR: NEGATIVE mg/dL (ref 0–75)
Nitrite: NEGATIVE
Ph: 5 (ref 4.5–8.0)
WBC UR: NONE SEEN /HPF (ref 0–5)

## 2012-05-31 LAB — CBC
HCT: 36 % (ref 35.0–47.0)
MCV: 90 fL (ref 80–100)
Platelet: 185 10*3/uL (ref 150–440)
RBC: 4 10*6/uL (ref 3.80–5.20)
WBC: 9.9 10*3/uL (ref 3.6–11.0)

## 2012-06-04 LAB — PATHOLOGY REPORT

## 2012-06-30 ENCOUNTER — Telehealth (HOSPITAL_COMMUNITY): Payer: Self-pay | Admitting: Emergency Medicine

## 2012-06-30 NOTE — ED Notes (Signed)
No response to letter sent after 30 days. Chart sent to Medical Records. °

## 2012-09-04 ENCOUNTER — Encounter (HOSPITAL_COMMUNITY): Payer: Self-pay | Admitting: Emergency Medicine

## 2012-09-04 ENCOUNTER — Emergency Department (HOSPITAL_COMMUNITY)
Admission: EM | Admit: 2012-09-04 | Discharge: 2012-09-04 | Disposition: A | Discharge status: Home / Self Care | Payer: Medicaid Other | Attending: Emergency Medicine | Admitting: Emergency Medicine

## 2012-09-04 DIAGNOSIS — Z79899 Other long term (current) drug therapy: Secondary | ICD-10-CM | POA: Insufficient documentation

## 2012-09-04 DIAGNOSIS — Z87891 Personal history of nicotine dependence: Secondary | ICD-10-CM | POA: Insufficient documentation

## 2012-09-04 DIAGNOSIS — Z331 Pregnant state, incidental: Secondary | ICD-10-CM | POA: Insufficient documentation

## 2012-09-04 DIAGNOSIS — J069 Acute upper respiratory infection, unspecified: Secondary | ICD-10-CM | POA: Insufficient documentation

## 2012-09-04 DIAGNOSIS — J3489 Other specified disorders of nose and nasal sinuses: Secondary | ICD-10-CM | POA: Insufficient documentation

## 2012-09-04 DIAGNOSIS — J029 Acute pharyngitis, unspecified: Secondary | ICD-10-CM | POA: Insufficient documentation

## 2012-09-04 MED ORDER — MAGIC MOUTHWASH W/LIDOCAINE: 5.0000 mL | Freq: Three times a day (TID) | ORAL | Status: DC | PRN

## 2012-09-04 MED ORDER — PSEUDOEPHEDRINE HCL 60 MG PO TABS: 60.0000 mg | ORAL_TABLET | Freq: Four times a day (QID) | ORAL | Status: DC | PRN

## 2012-09-04 MED ORDER — IBUPROFEN 600 MG PO TABS: 600.0000 mg | ORAL_TABLET | Freq: Four times a day (QID) | ORAL | Status: DC | PRN

## 2012-09-04 NOTE — ED Notes (Signed)
Pt c/o fever/ sore throat since yesterday.

## 2012-09-08 NOTE — ED Provider Notes (Signed)
History     CSN: [id number]  Arrival date & time 09/04/12  0900   First MD Initiated Contact with Patient 09/04/12 [phone]      Chief Complaint  Patient presents with  . Fever  . Sore Throat    (Consider location/radiation/quality/duration/timing/severity/associated sxs/prior treatment) Patient is a 22 y.o. female presenting with fever. The history is provided by the patient and a parent.  Fever Temp source:  Subjective Onset quality:  Gradual Duration:  1 day Timing:  Constant Progression:  Unchanged Chronicity:  New Relieved by:  Nothing Worsened by:  Nothing tried Ineffective treatments: OTC cold medication. Associated symptoms: congestion, rhinorrhea and sore throat   Associated symptoms: no chills, no confusion, no cough, no diarrhea, no dysuria, no ear pain, no headaches, no nausea, no rash and no vomiting     Past Medical History  Diagnosis Date  . Pregnant     History reviewed. No pertinent past surgical history.  No family history on file.  History  Substance Use Topics  . Smoking status: Former Games developer  . Smokeless tobacco: Not on file  . Alcohol Use: No    OB History   Grav Para Term Preterm Abortions TAB SAB Ect Mult Living   1 1  1     1 1       Review of Systems  Constitutional: Positive for fever. Negative for chills, activity change and appetite change.  HENT: Positive for congestion, sore throat and rhinorrhea. Negative for ear pain, facial swelling, trouble swallowing, neck pain, neck stiffness and voice change.   Eyes: Negative for visual disturbance.  Respiratory: Negative for cough, chest tightness, shortness of breath, wheezing and stridor.   Gastrointestinal: Negative for nausea, vomiting, abdominal pain and diarrhea.  Genitourinary: Negative for dysuria.  Skin: Negative for rash and wound.  Neurological: Negative for dizziness, weakness, numbness and headaches.  Hematological: Negative for adenopathy.  Psychiatric/Behavioral: Negative  for confusion.  All other systems reviewed and are negative.    Allergies  Tomato  Home Medications   Current Outpatient Rx  Name  Route  Sig  Dispense  Refill  . Phenylephrine-DM-GG-APAP (COLD & FLU SEVERE PO)   Oral   Take 2 tablets by mouth every 6 (six) hours as needed. Cold and flu         . Alum & Mag Hydroxide-Simeth (MAGIC MOUTHWASH W/LIDOCAINE) SOLN   Oral   Take 5 mLs by mouth 3 (three) times daily as needed. Swish and spit , do not swallow   100 mL   0   . ibuprofen (ADVIL,MOTRIN) 600 MG tablet   Oral   Take 1 tablet (600 mg total) by mouth every 6 (six) hours as needed for pain.   20 tablet   0   . pseudoephedrine (SUDAFED) 60 MG tablet   Oral   Take 1 tablet (60 mg total) by mouth every 6 (six) hours as needed for congestion.   20 tablet   0     BP 116/66  Pulse 91  Temp(Src) 99.2 F (37.3 C)  Resp 18  Ht 5\' 2"  (1.575 m)  Wt 130 lb (58.968 kg)  BMI 23.77 kg/m2  SpO2 100%  LMP 07/24/2012  Physical Exam  Nursing note and vitals reviewed. Constitutional: She is oriented to person, place, and time. She appears well-developed and well-nourished. No distress.  HENT:  Head: Normocephalic and atraumatic. No trismus in the jaw.  Right Ear: Tympanic membrane and ear canal normal. No mastoid tenderness. Tympanic membrane is  not erythematous and not bulging. No hemotympanum.  Left Ear: Tympanic membrane and ear canal normal. No mastoid tenderness. Tympanic membrane is not erythematous and not bulging. No hemotympanum.  Nose: Mucosal edema present. No rhinorrhea.  Mouth/Throat: Uvula is midline and mucous membranes are normal. No edematous. Posterior oropharyngeal erythema present. No oropharyngeal exudate, posterior oropharyngeal edema or tonsillar abscesses.  Eyes: Conjunctivae and EOM are normal. Pupils are equal, round, and reactive to light.  Neck: Normal range of motion and phonation normal. Neck supple. No Brudzinski's sign and no Kernig's sign noted.   Cardiovascular: Normal rate, regular rhythm, normal heart sounds and intact distal pulses.   No murmur heard. Pulmonary/Chest: Effort normal and breath sounds normal. No respiratory distress. She has no wheezes. She has no rales.  Abdominal: Soft. Bowel sounds are normal. She exhibits no distension. There is no tenderness. There is no rebound and no guarding.  Musculoskeletal: She exhibits no edema.  Lymphadenopathy:    She has no cervical adenopathy.  Neurological: She is alert and oriented to person, place, and time. She exhibits normal muscle tone. Coordination normal.  Skin: Skin is warm and dry.    ED Course  Procedures (including critical care time)  Labs Reviewed - No data to display No results found.   1. Pharyngitis   2. URI (upper respiratory infection)       MDM    Patient is alert, mucous membranes are moist.  No PTA, exudates or cervical lymphadenopathy. Doubt strep, likely viral illness.  Will treat symptomatically and mother agrees to return here if sx's worsen.  She is well appearing and stable for d/c      Margree Gimbel L. Maybel Dambrosio, PA-C 09/08/12 1423

## 2012-09-09 NOTE — ED Provider Notes (Signed)
Medical screening examination/treatment/procedure(s) were performed by non-physician practitioner and as supervising physician I was immediately available for consultation/collaboration.  Matisyn Cabeza, MD 09/09/12 1418 

## 2012-10-31 ENCOUNTER — Encounter: Payer: Self-pay | Admitting: Obstetrics and Gynecology

## 2012-10-31 ENCOUNTER — Ambulatory Visit (INDEPENDENT_AMBULATORY_CARE_PROVIDER_SITE_OTHER): Payer: BC Managed Care – PPO | Admitting: Obstetrics and Gynecology

## 2012-10-31 VITALS — BP 102/50 | Ht 62.0 in | Wt 129.4 lb

## 2012-10-31 DIAGNOSIS — N970 Female infertility associated with anovulation: Secondary | ICD-10-CM

## 2012-10-31 NOTE — Patient Instructions (Signed)
USE FErtility apps plus website called "my [email or web address]", also visibly [facility] for  reproductive medicine website, and [email or web address] for fertility information we'll check blood work today and repeat progesterone level on day 18 the next cycle, August 11

## 2012-10-31 NOTE — Progress Notes (Signed)
   [facility] Visit  Patient name: Tonya Burns MRN [id number]  Date of birth: 1991/03/08  CC & HPI:  Tonya Burns is a 22 y.o. female presenting today for fertility concerns. She is a gravida 1 para 0100 status post preterm delivery at [redacted] weeks gestation suspected due to cervical incompetency ROS:  Is only had one period since her pregnancy loss, in June 13 lasting 5 days  Pertinent History Reviewed:  Medical & Surgical Hx:  Reviewed: Significant for  Medications: Reviewed & Updated - see associated section Social History: Reviewed -  reports that she has quit smoking. She does not have any smokeless tobacco history on file.  Objective Findings:  Vitals: BP 102/50  Ht 5\' 2"  (1.575 m)  Wt 129 lb 6.4 oz (58.695 kg)  BMI 23.66 kg/m2  LMP 09/20/2012  Physical Examination: General appearance - alert, well appearing, and in no distress and normal appearing weight Mental status - alert, oriented to person, place, and time, normal mood, behavior, speech, dress, motor activity, and thought processes Abdomen - soft, nontender, nondistended, no masses or organomegaly Pelvic - normal external genitalia, vulva, vagina, cervix, uterus and adnexa,  CERVICAL MUCUS ON FERN TESTING: FERN POSITIVE.(ANOVULATORY)    Assessment & Plan:   ANOVULATION   CHECK tsh, BEGIN CLOMIPHENE 50 MG X 5 DAYS, CHECK FSH, TSH TODAY CHECK PROGESTERONE LEVEL, ON DAY 18 AUG 11

## 2012-11-01 ENCOUNTER — Telehealth: Payer: Self-pay | Admitting: Obstetrics and Gynecology

## 2012-11-01 ENCOUNTER — Other Ambulatory Visit: Payer: Self-pay | Admitting: Obstetrics and Gynecology

## 2012-11-01 DIAGNOSIS — N970 Female infertility associated with anovulation: Secondary | ICD-10-CM

## 2012-11-01 LAB — COMPREHENSIVE METABOLIC PANEL
Albumin: 4.4 g/dL (ref 3.5–5.2)
CO2: 25 mEq/L (ref 19–32)
Calcium: 9.1 mg/dL (ref 8.4–10.5)
Chloride: 107 mEq/L (ref 96–112)
Glucose, Bld: 74 mg/dL (ref 70–99)
Potassium: 3.9 mEq/L (ref 3.5–5.3)
Sodium: 139 mEq/L (ref 135–145)
Total Bilirubin: 0.9 mg/dL (ref 0.3–1.2)
Total Protein: 7.2 g/dL (ref 6.0–8.3)

## 2012-11-01 MED ORDER — CLOMIPHENE CITRATE 50 MG PO TABS: 50.0000 mg | ORAL_TABLET | Freq: Every day | ORAL | Status: DC

## 2012-11-01 NOTE — Telephone Encounter (Signed)
Pt stated Dr. Emelda Fear was supposed to send a Rx to the [facility] in [place] but it wasn't called in. I spoke with the pt and she stated it was supposed to be 50 mg of something for infertility.

## 2012-11-03 NOTE — Telephone Encounter (Signed)
Rx for Clomid e-scribed to [facility] on 7/25

## 2012-11-22 ENCOUNTER — Encounter: Payer: Self-pay | Admitting: Obstetrics and Gynecology

## 2012-11-22 ENCOUNTER — Ambulatory Visit (INDEPENDENT_AMBULATORY_CARE_PROVIDER_SITE_OTHER): Payer: BC Managed Care – PPO | Admitting: Obstetrics and Gynecology

## 2012-11-22 VITALS — BP 110/62 | Ht 62.0 in | Wt 129.0 lb

## 2012-11-22 DIAGNOSIS — Z3200 Encounter for pregnancy test, result unknown: Secondary | ICD-10-CM

## 2012-11-22 DIAGNOSIS — N970 Female infertility associated with anovulation: Secondary | ICD-10-CM

## 2012-11-22 DIAGNOSIS — Z3202 Encounter for pregnancy test, result negative: Secondary | ICD-10-CM

## 2012-11-22 LAB — POCT URINE PREGNANCY: Preg Test, Ur: NEGATIVE

## 2012-11-22 MED ORDER — CLOMIPHENE CITRATE 50 MG PO TABS: 50.0000 mg | ORAL_TABLET | Freq: Every day | ORAL | Status: DC

## 2012-11-22 NOTE — Progress Notes (Signed)
   [facility] Visit  Patient name: Tonya Burns MRN [id number]  Date of birth: December 14, 1990  CC & HPI:  Tonya Burns is a 22 y.o. female presenting today for followup anovulation  ROS:  Patient took Clomid 50 mg x5 days earlier this month. She did not have progesterone level drawn 811 due to miscommunication  Pertinent History Reviewed:  Medical & Surgical Hx:  Reviewed: Significant for history of cervical incompetence with pregnancy loss at 22 weeks Medications: Reviewed & Updated - see associated section Social History: Reviewed -  reports that she has quit smoking. She has never used smokeless tobacco.  Objective Findings:  Vitals: BP 110/62  Ht 5\' 2"  (1.575 m)  Wt 129 lb (58.514 kg)  BMI 23.59 kg/m2  LMP 09/20/2012  Breastfeeding? No  Physical Examination: General appearance - alert, well appearing, and in no distress Abdomen - soft, nontender, nondistended, no masses or organomegaly Pelvic - VULVA: normal appearing vulva with no masses, tenderness or lesions, VAGINA: normal appearing vagina with normal color and discharge, no lesions, cervical mucus white, thick not anovulatory, CERVIX: normal appearing cervix without discharge or lesions, cervical motion tenderness absent   Assessment & Plan:   Probable ovulation status post Clomid use   Cross her progesterone level today followup by phone, will increase or maintain Clomid dose  based on results

## 2012-11-23 LAB — PROGESTERONE: Progesterone: 43.8 ng/mL

## 2012-12-11 ENCOUNTER — Ambulatory Visit: Payer: BC Managed Care – PPO | Admitting: Obstetrics and Gynecology

## 2012-12-23 ENCOUNTER — Ambulatory Visit (INDEPENDENT_AMBULATORY_CARE_PROVIDER_SITE_OTHER): Payer: BC Managed Care – PPO

## 2012-12-23 ENCOUNTER — Other Ambulatory Visit: Payer: Self-pay | Admitting: Obstetrics & Gynecology

## 2012-12-23 DIAGNOSIS — O09299 Supervision of pregnancy with other poor reproductive or obstetric history, unspecified trimester: Secondary | ICD-10-CM

## 2012-12-23 DIAGNOSIS — O3680X0 Pregnancy with inconclusive fetal viability, not applicable or unspecified: Secondary | ICD-10-CM

## 2012-12-23 NOTE — Progress Notes (Signed)
U/S-single IUP with +FCA noted, CRL c/w 8+0wks, EDD 08/04/2013, cx long and closed, C.L. Noted on Rt = 4.5x3.7cm, no free fluid noted within pelvis

## 2012-12-26 ENCOUNTER — Encounter: Payer: Self-pay | Admitting: Advanced Practice Midwife

## 2012-12-26 ENCOUNTER — Telehealth: Payer: Self-pay | Admitting: Adult Health

## 2012-12-26 ENCOUNTER — Ambulatory Visit (INDEPENDENT_AMBULATORY_CARE_PROVIDER_SITE_OTHER): Payer: BC Managed Care – PPO | Admitting: Advanced Practice Midwife

## 2012-12-26 VITALS — BP 100/40 | Wt 133.5 lb

## 2012-12-26 DIAGNOSIS — Z1389 Encounter for screening for other disorder: Secondary | ICD-10-CM

## 2012-12-26 DIAGNOSIS — O2390 Unspecified genitourinary tract infection in pregnancy, unspecified trimester: Secondary | ICD-10-CM

## 2012-12-26 DIAGNOSIS — Z331 Pregnant state, incidental: Secondary | ICD-10-CM

## 2012-12-26 DIAGNOSIS — O09291 Supervision of pregnancy with other poor reproductive or obstetric history, first trimester: Secondary | ICD-10-CM

## 2012-12-26 LAB — POCT URINALYSIS DIPSTICK
Blood, UA: NEGATIVE
Nitrite, UA: NEGATIVE
Protein, UA: NEGATIVE

## 2012-12-26 NOTE — Progress Notes (Signed)
Has right ov cyst.  Had a pain in her right side, lifted a heavy box at work, and noticed a spot of bright red blood when she went to bathroom.  SSE:  No blood at all, + yeast. OTC vaginal meds recommended and note for work no lifting > 25 lbs

## 2012-12-26 NOTE — Progress Notes (Signed)
Pain in right side. Noticed bright red spotting today when she wiped.

## 2012-12-26 NOTE — Telephone Encounter (Signed)
Pt states lifted boxes at work that was greater than 50 lbs. Has had spotting and pain in lower abdomen since lifted box. Pt states has an hx of "weak cervics" Pt states employer told her she would need a note stating her restrictions for work or she would not be able to work. Call transferred to front staff for pt to be seen today for spotting and pain.

## 2012-12-30 ENCOUNTER — Encounter: Payer: Self-pay | Admitting: Adult Health

## 2012-12-30 ENCOUNTER — Ambulatory Visit (INDEPENDENT_AMBULATORY_CARE_PROVIDER_SITE_OTHER): Payer: BC Managed Care – PPO | Admitting: Adult Health

## 2012-12-30 ENCOUNTER — Other Ambulatory Visit (HOSPITAL_COMMUNITY)
Admission: RE | Admit: 2012-12-30 | Discharge: 2012-12-30 | Disposition: A | Discharge status: Home / Self Care | Payer: Medicaid Other | Source: Ambulatory Visit | Attending: Adult Health | Admitting: Adult Health

## 2012-12-30 VITALS — BP 96/60 | Wt 133.0 lb

## 2012-12-30 DIAGNOSIS — Z1389 Encounter for screening for other disorder: Secondary | ICD-10-CM

## 2012-12-30 DIAGNOSIS — O09299 Supervision of pregnancy with other poor reproductive or obstetric history, unspecified trimester: Secondary | ICD-10-CM

## 2012-12-30 DIAGNOSIS — O09219 Supervision of pregnancy with history of pre-term labor, unspecified trimester: Secondary | ICD-10-CM

## 2012-12-30 DIAGNOSIS — O09291 Supervision of pregnancy with other poor reproductive or obstetric history, first trimester: Secondary | ICD-10-CM

## 2012-12-30 DIAGNOSIS — Z3490 Encounter for supervision of normal pregnancy, unspecified, unspecified trimester: Secondary | ICD-10-CM

## 2012-12-30 DIAGNOSIS — Z3481 Encounter for supervision of other normal pregnancy, first trimester: Secondary | ICD-10-CM

## 2012-12-30 DIAGNOSIS — Z113 Encounter for screening for infections with a predominantly sexual mode of transmission: Secondary | ICD-10-CM | POA: Insufficient documentation

## 2012-12-30 DIAGNOSIS — O99019 Anemia complicating pregnancy, unspecified trimester: Secondary | ICD-10-CM

## 2012-12-30 DIAGNOSIS — Z01419 Encounter for gynecological examination (general) (routine) without abnormal findings: Secondary | ICD-10-CM | POA: Insufficient documentation

## 2012-12-30 HISTORY — DX: Encounter for supervision of normal pregnancy, unspecified, unspecified trimester: Z34.90

## 2012-12-30 LAB — CBC
Hemoglobin: 11.8 g/dL — ABNORMAL LOW (ref 12.0–15.0)
Platelets: 255 10*3/uL (ref 150–400)
RBC: 4.21 MIL/uL (ref 3.87–5.11)

## 2012-12-30 LAB — POCT URINALYSIS DIPSTICK
Glucose, UA: NEGATIVE
Ketones, UA: NEGATIVE
Leukocytes, UA: NEGATIVE
Nitrite, UA: NEGATIVE

## 2012-12-30 NOTE — Progress Notes (Signed)
  Subjective:    Tonya Burns is a 22 y.o. G23P0100 African American female at [redacted]w[redacted]d by [place] being seen today for her first obstetrical visit.  Her obstetrical history is significant for incompent cervix with pregnancy loss in second trimester.  Pregnancy history fully reviewed.   Patient reports constipation.Try senokot and prune juice.  Filed Vitals:   12/30/12 1156  BP: 96/60  Weight: 133 lb (60.328 kg)    HISTORY: OB History  Gravida Para Term Preterm AB SAB TAB Ectopic Multiple Living  2 1  1     1  0    # Outcome Date GA Lbr Len/2nd Weight Sex Delivery Anes PTL Lv  2 CUR           1 PRE 04/26/12 [redacted]w[redacted]d / 01:03 13.9 oz (0.394 kg) M SVD None Y ND     Past Medical History  Diagnosis Date  . Pregnant   . Pregnant 12/30/2012   History reviewed. No pertinent past surgical history. Family History  Problem Relation Age of Onset  . Hypertension Father      Exam    Pelvic Exam:    Perineum: Normal Perineum   Vulva: normal   Vagina:  normal mucosa, normal discharge, no palpable nodules   Uterus   9 week size     Cervix: normal   Adnexa: Not palpable   Urinary: urethral meatus normal    System:     Skin: normal coloration and turgor, no rashes    Neurologic: oriented, normal mood   Extremities: normal strength, tone, and muscle mass   HEENT PERRLA   Mouth/Teeth mucous membranes moist   Cardiovascular: regular rate and rhythm   Respiratory:  appears well, vitals normal, no respiratory distress, acyanotic, normal RR   Abdomen: soft, non-tender    Thin prep pap smear done.   Assessment:    Pregnancy: G2P0100 Patient Active Problem List   Diagnosis Date Noted  . Pregnant 12/30/2012  . H/O incompetent cervix, currently pregnant 12/26/2012  . Infertility, anovulation 10/31/2012  . Preterm delivery, delivered 04/27/2012      [redacted]w[redacted]d G2P0100 New OB visit    Plan:     Initial labs drawn Continue prenatal vitamins Problem list reviewed and updated Reviewed  recommended weight gain based on pre-gravid BMI Encouraged well-balanced diet Genetic Screening discussed Integrated Screen: requested Cystic fibrosis screening discussed declined Ultrasound discussed; fetal survey: requested Follow up in 3 weeks for IN/NT and see Dr Despina Hidden to discuss cerclage  Anahi Belmar 12/30/2012 2:18 PM

## 2012-12-30 NOTE — Patient Instructions (Addendum)
Constipation, Adult Constipation is when a person has fewer than 3 bowel movements a week; has difficulty having a bowel movement; or has stools that are dry, hard, or larger than normal. As people grow older, constipation is more common. If you try to fix constipation with medicines that make you have a bowel movement (laxatives), the problem may get worse. Long-term laxative use may cause the muscles of the colon to become weak. A low-fiber diet, not taking in enough fluids, and taking certain medicines may make constipation worse. CAUSES   Certain medicines, such as antidepressants, pain medicine, iron supplements, antacids, and water pills.   Certain diseases, such as diabetes, irritable bowel syndrome (IBS), thyroid disease, or depression.   Not drinking enough water.   Not eating enough fiber-rich foods.   Stress or travel.  Lack of physical activity or exercise.  Not going to the restroom when there is the urge to have a bowel movement.  Ignoring the urge to have a bowel movement.  Using laxatives too much. SYMPTOMS   Having fewer than 3 bowel movements a week.   Straining to have a bowel movement.   Having hard, dry, or larger than normal stools.   Feeling full or bloated.   Pain in the lower abdomen.  Not feeling relief after having a bowel movement. DIAGNOSIS  Your caregiver will take a medical history and perform a physical exam. Further testing may be done for severe constipation. Some tests may include:   A barium enema X-ray to examine your rectum, colon, and sometimes, your small intestine.  A sigmoidoscopy to examine your lower colon.  A colonoscopy to examine your entire colon. TREATMENT  Treatment will depend on the severity of your constipation and what is causing it. Some dietary treatments include drinking more fluids and eating more fiber-rich foods. Lifestyle treatments may include regular exercise. If these diet and lifestyle recommendations  do not help, your caregiver may recommend taking over-the-counter laxative medicines to help you have bowel movements. Prescription medicines may be prescribed if over-the-counter medicines do not work.  HOME CARE INSTRUCTIONS   Increase dietary fiber in your diet, such as fruits, vegetables, whole grains, and beans. Limit high-fat and processed sugars in your diet, such as [place] fries, hamburgers, cookies, candies, and soda.   A fiber supplement may be added to your diet if you cannot get enough fiber from foods.   Drink enough fluids to keep your urine clear or pale yellow.   Exercise regularly or as directed by your caregiver.   Go to the restroom when you have the urge to go. Do not hold it.  Only take medicines as directed by your caregiver. Do not take other medicines for constipation without talking to your caregiver first. SEEK IMMEDIATE MEDICAL CARE IF:   You have bright red blood in your stool.   Your constipation lasts for more than 4 days or gets worse.   You have abdominal or rectal pain.   You have thin, pencil-like stools.  You have unexplained weight loss. MAKE SURE YOU:   Understand these instructions.  Will watch your condition.  Will get help right away if you are not doing well or get worse. Document Released: 12/24/2003 Document Revised: 06/19/2011 Document Reviewed: 02/28/2011 [facility] Patient Information 2014 [place], [place]. Pregnancy - First Trimester During sexual intercourse, millions of sperm go into the vagina. Only 1 sperm will penetrate and fertilize the female egg while it is in the Fallopian tube. One week later, the fertilized  egg implants into the wall of the uterus. An embryo begins to develop into a baby. At 6 to 8 weeks, the eyes and face are formed and the heartbeat can be seen on ultrasound. At the end of 12 weeks (first trimester), all the baby's organs are formed. Now that you are pregnant, you will want to do everything you can to  have a healthy baby. Two of the most important things are to get good prenatal care and follow your caregiver's instructions. Prenatal care is all the medical care you receive before the baby's birth. It is given to prevent, find, and treat problems during the pregnancy and childbirth. PRENATAL EXAMS  During prenatal visits, your weight, blood pressure, and urine are checked. This is done to make sure you are healthy and progressing normally during the pregnancy.  A pregnant woman should gain 25 to 35 pounds during the pregnancy. However, if you are overweight or underweight, your caregiver will advise you regarding your weight.  Your caregiver will ask and answer questions for you.  Blood work, cervical cultures, other necessary tests, and a Pap test are done during your prenatal exams. These tests are done to check on your health and the probable health of your baby. Tests are strongly recommended and done for HIV with your permission. This is the virus that causes AIDS. These tests are done because medicines can be given to help prevent your baby from being born with this infection should you have been infected without knowing it. Blood work is also used to find out your blood type, previous infections, and follow your blood levels (hemoglobin).  Low hemoglobin (anemia) is common during pregnancy. Iron and vitamins are given to help prevent this. Later in the pregnancy, blood tests for diabetes will be done along with any other tests if any problems develop.  You may need other tests to make sure you and the baby are doing well. CHANGES DURING THE FIRST TRIMESTER  Your body goes through many changes during pregnancy. They vary from person to person. Talk to your caregiver about changes you notice and are concerned about. Changes can include:  Your menstrual period stops.  The egg and sperm carry the genes that determine what you look like. Genes from you and your partner are forming a baby. The  female genes determine whether the baby is a boy or a girl.  Your body increases in girth and you may feel bloated.  Feeling sick to your stomach (nauseous) and throwing up (vomiting). If the vomiting is uncontrollable, call your caregiver.  Your breasts will begin to enlarge and become tender.  Your nipples may stick out more and become darker.  The need to urinate more. Painful urination may mean you have a bladder infection.  Tiring easily.  Loss of appetite.  Cravings for certain kinds of food.  At first, you may gain or lose a couple of pounds.  You may have changes in your emotions from day to day (excited to be pregnant or concerned something may go wrong with the pregnancy and baby).  You may have more vivid and strange dreams. HOME CARE INSTRUCTIONS   It is very important to avoid all smoking, alcohol and non-prescribed drugs during your pregnancy. These affect the formation and growth of the baby. Avoid chemicals while pregnant to ensure the delivery of a healthy infant.  Start your prenatal visits by the 12th week of pregnancy. They are usually scheduled monthly at first, then more often in the  last 2 months before delivery. Keep your caregiver's appointments. Follow your caregiver's instructions regarding medicine use, blood and lab tests, exercise, and diet.  During pregnancy, you are providing food for you and your baby. Eat regular, well-balanced meals. Choose foods such as meat, fish, milk and other low fat dairy products, vegetables, fruits, and whole-grain breads and cereals. Your caregiver will tell you of the ideal weight gain.  You can help morning sickness by keeping soda crackers at the bedside. Eat a couple before arising in the morning. You may want to use the crackers without salt on them.  Eating 4 to 5 small meals rather than 3 large meals a day also may help the nausea and vomiting.  Drinking liquids between meals instead of during meals also seems to  help nausea and vomiting.  A physical sexual relationship may be continued throughout pregnancy if there are no other problems. Problems may be early (premature) leaking of amniotic fluid from the membranes, vaginal bleeding, or belly (abdominal) pain.  Exercise regularly if there are no restrictions. Check with your caregiver or physical therapist if you are unsure of the safety of some of your exercises. Greater weight gain will occur in the last 2 trimesters of pregnancy. Exercising will help:  Control your weight.  Keep you in shape.  Prepare you for labor and delivery.  Help you lose your pregnancy weight after you deliver your baby.  Wear a good support or jogging bra for breast tenderness during pregnancy. This may help if worn during sleep too.  Ask when prenatal classes are available. Begin classes when they are offered.  Do not use hot tubs, steam rooms, or saunas.  Wear your seat belt when driving. This protects you and your baby if you are in an accident.  Avoid raw meat, uncooked cheese, cat litter boxes, and soil used by cats throughout the pregnancy. These carry germs that can cause birth defects in the baby.  The first trimester is a good time to visit your dentist for your dental health. Getting your teeth cleaned is okay. Use a softer toothbrush and brush gently during pregnancy.  Ask for help if you have financial, counseling, or nutritional needs during pregnancy. Your caregiver will be able to offer counseling for these needs as well as refer you for other special needs.  Do not take any medicines or herbs unless told by your caregiver.  Inform your caregiver if there is any mental or physical domestic violence.  Make a list of emergency phone numbers of family, friends, hospital, and police and fire departments.  Write down your questions. Take them to your prenatal visit.  Do not douche.  Do not cross your legs.  If you have to stand for long periods of  time, rotate you feet or take small steps in a circle.  You may have more vaginal secretions that may require a sanitary pad. Do not use tampons or scented sanitary pads. MEDICINES AND DRUG USE IN PREGNANCY  Take prenatal vitamins as directed. The vitamin should contain 1 milligram of folic acid. Keep all vitamins out of reach of children. Only a couple vitamins or tablets containing iron may be fatal to a baby or young child when ingested.  Avoid use of all medicines, including herbs, over-the-counter medicines, not prescribed or suggested by your caregiver. Only take over-the-counter or prescription medicines for pain, discomfort, or fever as directed by your caregiver. Do not use aspirin, ibuprofen, or naproxen unless directed by your caregiver.  Let your caregiver also know about herbs you may be using.  Alcohol is related to a number of birth defects. This includes fetal alcohol syndrome. All alcohol, in any form, should be avoided completely. Smoking will cause low birth rate and premature babies.  Street or illegal drugs are very harmful to the baby. They are absolutely forbidden. A baby born to an addicted mother will be addicted at birth. The baby will go through the same withdrawal an adult does.  Let your caregiver know about any medicines that you have to take and for what reason you take them. SEEK MEDICAL CARE IF:  You have any concerns or worries during your pregnancy. It is better to call with your questions if you feel they cannot wait, rather than worry about them. SEEK IMMEDIATE MEDICAL CARE IF:   An unexplained oral temperature above 102 F (38.9 C) develops, or as your caregiver suggests.  You have leaking of fluid from the vagina (birth canal). If leaking membranes are suspected, take your temperature and inform your caregiver of this when you call.  There is vaginal spotting or bleeding. Notify your caregiver of the amount and how many pads are used.  You develop a  bad smelling vaginal discharge with a change in the color.  You continue to feel sick to your stomach (nauseated) and have no relief from remedies suggested. You vomit blood or coffee ground-like materials.  You lose more than 2 pounds of weight in 1 week.  You gain more than 2 pounds of weight in 1 week and you notice swelling of your face, hands, feet, or legs.  You gain 5 pounds or more in 1 week (even if you do not have swelling of your hands, face, legs, or feet).  You get exposed to [place] measles and have never had them.  You are exposed to fifth disease or chickenpox.  You develop belly (abdominal) pain. Round ligament discomfort is a common non-cancerous (benign) cause of abdominal pain in pregnancy. Your caregiver still must evaluate this.  You develop headache, fever, diarrhea, pain with urination, or shortness of breath.  You fall or are in a car accident or have any kind of trauma.  There is mental or physical violence in your home. Document Released: 03/21/2001 Document Revised: 12/20/2011 Document Reviewed: 09/22/2008 [facility] Patient Information 2014 [place], [place]. Return in 3 weeks for IT/NT and see Dr Despina Hidden to talk cercalge Cerclage of the Cervix Cerclage of the cervix is a surgical procedure for an incompetent cervix. An incompetent cervix is a weak cervix that opens up before labor begins. Cerclage of the cervix sews the cervix closed during pregnancy.  LET YOUR CAREGIVER KNOW ABOUT:   Allergies to foods or medications.  All over-the-counter, prescription, herbal, eye drops and cream medications you are using.  Taking illegal drugs or drinking an excessive amount of alcohol.  Any recent colds or infections.  Past problems with anesthetics or novocaine.  Past surgery.  History of blood clots or abnormal bleeding problems.  Other medical or health problems. RISKS AND COMPLICATIONS   Infection.  Bleeding.  Rupturing the amniotic sac  (membranes).  Going into early labor and delivery.  Problems with the anesthesia.  Infection of the amniotic sac. BEFORE THE PROCEDURE   Do not take aspirin.  Do not eat or drink anything 8 hours before the procedure.  Do not smoke.  If you are being admitted the same day as the procedure, arrive at the hospital at least 60 minutes  before the surgery or as directed. During this time, you will sign the necessary forms and get prepared for the surgery.  A waiting area is available for family and friends. PROCEDURE   You will be given an IV (intravenous) and medication to relax you.  You will be put to sleep with a general anesthetic.  A stitch will be placed in and around the cervix to tighten it and keep it closed. AFTER THE PROCEDURE   You will go to a recovery room where you and the baby are monitored.  Once you are awake, stable, and taking fluids well, barring other problems, you will be allowed to return to your room.  You will usually stay in the hospital overnight.  You may get an injection of progesterone to prevent uterine contractions.  Have someone drive you home and stay with you for a day or two.  You may be given medications to take when you go home. HOME CARE INSTRUCTIONS   Only take over-the-counter or prescriptions medicines for pain, discomfort or fever as directed by your caregiver.  Avoid physical activities and exercise until your caregiver says it is okay.  Resume your usual diet.  Do not douche.  Do not have sexual intercourse until your caregiver tells you it is OK.  Keep your follow up surgical and prenatal appointments with your caregiver. SEEK MEDICAL CARE IF:   You have abnormal vaginal discharge.  You develop a rash.  You are having problems with your medications.  You become lightheaded or feel faint. SEEK IMMEDIATE MEDICAL CARE IF:   You develop vaginal bleeding.  You are leaking fluid or have a gush of fluid from the  vagina.  You develop a temperature of 102 F (38.9 C) or higher.  You pass out.  You have uterine contractions.  You feel the baby is not moving as much as usual or cannot feel the baby move. Document Released: 03/09/2008 Document Revised: 06/19/2011 Document Reviewed: 03/09/2008 [facility] Patient Information 2014 [place], [place]. Try senokot and prune juice

## 2012-12-30 NOTE — Progress Notes (Signed)
Pt here today for new OB visit. Pt denies any problems or concerns at this time. Pt given CCNC form and lab consents to read over and sign.

## 2012-12-31 LAB — HIV ANTIBODY (ROUTINE TESTING W REFLEX): HIV: NONREACTIVE

## 2012-12-31 LAB — DRUG SCREEN, URINE, NO CONFIRMATION
Amphetamine Screen, Ur: NEGATIVE
Benzodiazepines.: NEGATIVE
Cocaine Metabolites: NEGATIVE
Marijuana Metabolite: NEGATIVE
Methadone: NEGATIVE

## 2012-12-31 LAB — URINALYSIS
Hgb urine dipstick: NEGATIVE
Ketones, ur: NEGATIVE mg/dL
Leukocytes, UA: NEGATIVE
Nitrite: NEGATIVE
Specific Gravity, Urine: >1.03 — ABNORMAL HIGH (ref 1.005–1.030)
Urobilinogen, UA: 0.2 mg/dL (ref 0.0–1.0)
pH: 5.5 (ref 5.0–8.0)

## 2012-12-31 LAB — VARICELLA ZOSTER ANTIBODY, IGG: Varicella IgG: >4000 Index — ABNORMAL HIGH (ref <135.00)

## 2012-12-31 LAB — URINE CULTURE

## 2012-12-31 LAB — ABO AND RH: Rh Type: POSITIVE

## 2012-12-31 LAB — TSH: TSH: 1.591 u[IU]/mL (ref 0.350–4.500)

## 2012-12-31 LAB — OXYCODONE SCREEN, UA, RFLX CONFIRM: Oxycodone Screen, Ur: NEGATIVE ng/mL

## 2013-01-20 ENCOUNTER — Other Ambulatory Visit: Payer: BC Managed Care – PPO

## 2013-01-21 ENCOUNTER — Encounter: Payer: Self-pay | Admitting: Obstetrics & Gynecology

## 2013-01-21 ENCOUNTER — Encounter (INDEPENDENT_AMBULATORY_CARE_PROVIDER_SITE_OTHER): Payer: Self-pay

## 2013-01-21 ENCOUNTER — Ambulatory Visit (INDEPENDENT_AMBULATORY_CARE_PROVIDER_SITE_OTHER): Payer: BC Managed Care – PPO | Admitting: Obstetrics & Gynecology

## 2013-01-21 ENCOUNTER — Other Ambulatory Visit: Payer: Self-pay | Admitting: Obstetrics & Gynecology

## 2013-01-21 ENCOUNTER — Ambulatory Visit (INDEPENDENT_AMBULATORY_CARE_PROVIDER_SITE_OTHER): Payer: BC Managed Care – PPO

## 2013-01-21 VITALS — BP 90/50 | Wt 136.0 lb

## 2013-01-21 DIAGNOSIS — O09219 Supervision of pregnancy with history of pre-term labor, unspecified trimester: Secondary | ICD-10-CM

## 2013-01-21 DIAGNOSIS — O99019 Anemia complicating pregnancy, unspecified trimester: Secondary | ICD-10-CM

## 2013-01-21 DIAGNOSIS — Z1389 Encounter for screening for other disorder: Secondary | ICD-10-CM

## 2013-01-21 DIAGNOSIS — Z36 Encounter for antenatal screening of mother: Secondary | ICD-10-CM

## 2013-01-21 DIAGNOSIS — O09299 Supervision of pregnancy with other poor reproductive or obstetric history, unspecified trimester: Secondary | ICD-10-CM

## 2013-01-21 DIAGNOSIS — Z3490 Encounter for supervision of normal pregnancy, unspecified, unspecified trimester: Secondary | ICD-10-CM

## 2013-01-21 LAB — POCT URINALYSIS DIPSTICK
Blood, UA: NEGATIVE
Glucose, UA: NEGATIVE
Leukocytes, UA: NEGATIVE
Nitrite, UA: NEGATIVE

## 2013-01-21 NOTE — Progress Notes (Signed)
U/S(12+1wks)-single IUP with +FCA noted, cx long and closed( 3.09 Cm), bilateral adnexa wnl, C.L. Remains on RT=3.0cm simple, NB present, NT-1.37mm

## 2013-01-21 NOTE — Progress Notes (Signed)
FOLLOW-UP U/S. 

## 2013-01-21 NOTE — Progress Notes (Signed)
G2P0100 with history of preterm labor and loss @ 22 weeks presents for follow up. No complaints today; she reports she is doing well. No bleeding, loss of fluid, vaginal discharge.   FHR 154 today.

## 2013-01-21 NOTE — Progress Notes (Signed)
Will bring pt back in 3 days to discuss management with Dr Emelda Fear as well.

## 2013-01-24 ENCOUNTER — Encounter: Payer: Self-pay | Admitting: Obstetrics & Gynecology

## 2013-01-24 ENCOUNTER — Ambulatory Visit (INDEPENDENT_AMBULATORY_CARE_PROVIDER_SITE_OTHER): Payer: BC Managed Care – PPO | Admitting: Obstetrics & Gynecology

## 2013-01-24 VITALS — BP 100/60 | Wt 136.0 lb

## 2013-01-24 DIAGNOSIS — O99019 Anemia complicating pregnancy, unspecified trimester: Secondary | ICD-10-CM

## 2013-01-24 DIAGNOSIS — O09219 Supervision of pregnancy with history of pre-term labor, unspecified trimester: Secondary | ICD-10-CM

## 2013-01-24 DIAGNOSIS — Z1389 Encounter for screening for other disorder: Secondary | ICD-10-CM

## 2013-01-24 DIAGNOSIS — O09299 Supervision of pregnancy with other poor reproductive or obstetric history, unspecified trimester: Secondary | ICD-10-CM

## 2013-01-24 DIAGNOSIS — O3430 Maternal care for cervical incompetence, unspecified trimester: Secondary | ICD-10-CM

## 2013-01-24 LAB — MATERNAL SCREEN, INTEGRATED #1

## 2013-01-24 LAB — POCT URINALYSIS DIPSTICK
Glucose, UA: NEGATIVE
Nitrite, UA: NEGATIVE
Protein, UA: NEGATIVE

## 2013-02-04 ENCOUNTER — Other Ambulatory Visit: Payer: Self-pay | Admitting: Obstetrics & Gynecology

## 2013-02-04 DIAGNOSIS — N883 Incompetence of cervix uteri: Secondary | ICD-10-CM

## 2013-02-07 ENCOUNTER — Encounter: Payer: Self-pay | Admitting: Obstetrics & Gynecology

## 2013-02-07 ENCOUNTER — Ambulatory Visit (INDEPENDENT_AMBULATORY_CARE_PROVIDER_SITE_OTHER): Payer: BC Managed Care – PPO | Admitting: Obstetrics & Gynecology

## 2013-02-07 ENCOUNTER — Encounter (INDEPENDENT_AMBULATORY_CARE_PROVIDER_SITE_OTHER): Payer: Self-pay

## 2013-02-07 ENCOUNTER — Ambulatory Visit (INDEPENDENT_AMBULATORY_CARE_PROVIDER_SITE_OTHER): Payer: BC Managed Care – PPO

## 2013-02-07 VITALS — BP 80/60 | Wt 138.0 lb

## 2013-02-07 DIAGNOSIS — Z1389 Encounter for screening for other disorder: Secondary | ICD-10-CM

## 2013-02-07 DIAGNOSIS — O3430 Maternal care for cervical incompetence, unspecified trimester: Secondary | ICD-10-CM

## 2013-02-07 DIAGNOSIS — N883 Incompetence of cervix uteri: Secondary | ICD-10-CM

## 2013-02-07 DIAGNOSIS — O99019 Anemia complicating pregnancy, unspecified trimester: Secondary | ICD-10-CM

## 2013-02-07 DIAGNOSIS — O09892 Supervision of other high risk pregnancies, second trimester: Secondary | ICD-10-CM

## 2013-02-07 DIAGNOSIS — O09299 Supervision of pregnancy with other poor reproductive or obstetric history, unspecified trimester: Secondary | ICD-10-CM

## 2013-02-07 DIAGNOSIS — O09219 Supervision of pregnancy with history of pre-term labor, unspecified trimester: Secondary | ICD-10-CM

## 2013-02-07 DIAGNOSIS — Z331 Pregnant state, incidental: Secondary | ICD-10-CM

## 2013-02-07 LAB — POCT URINALYSIS DIPSTICK
Blood, UA: NEGATIVE
Nitrite, UA: NEGATIVE
Protein, UA: NEGATIVE

## 2013-02-07 MED ORDER — PROGESTERONE MICRONIZED 200 MG PO CAPS: ORAL_CAPSULE | ORAL | Status: DC

## 2013-02-07 NOTE — Progress Notes (Signed)
U/S(14+4wks)-active fetus, FHR-148 bpm, cx closed = 2.7 & 2.8cm closed no funneling noted (measured vaginally), C.L. On Rt remains = 2.4cm, no free fluid noted

## 2013-02-07 NOTE — Progress Notes (Signed)
Cervical length stable, will continue to do every 2 weeks, I am going to add Prometrium for pt and start her 17P in 2 weeks No complaints no bleeding

## 2013-02-17 NOTE — Progress Notes (Signed)
A detailed in depth thorough evaluation of the notes from the pts presentation and delivery was undertaken.  I had Dr Emelda Fear do the same.  Our conclusion is that the pregnancy loss just above 20 weeks for pt was indeed not consistent with an incompetent cervix although it seems to have gotten mislabeled that way in the notes.  As a result for the time being we will not proceed with a cerclage [placement and will with the 17P but add prometrium and do close surveillance of the cervical length and look for assymptomatic shortenin.  Pt and her Mom agree with this approach.

## 2013-02-21 ENCOUNTER — Ambulatory Visit (INDEPENDENT_AMBULATORY_CARE_PROVIDER_SITE_OTHER): Payer: BC Managed Care – PPO | Admitting: Obstetrics & Gynecology

## 2013-02-21 ENCOUNTER — Ambulatory Visit (INDEPENDENT_AMBULATORY_CARE_PROVIDER_SITE_OTHER): Payer: BC Managed Care – PPO

## 2013-02-21 ENCOUNTER — Other Ambulatory Visit: Payer: Self-pay | Admitting: Obstetrics & Gynecology

## 2013-02-21 ENCOUNTER — Encounter: Payer: Self-pay | Admitting: Obstetrics & Gynecology

## 2013-02-21 ENCOUNTER — Encounter (INDEPENDENT_AMBULATORY_CARE_PROVIDER_SITE_OTHER): Payer: Self-pay

## 2013-02-21 DIAGNOSIS — O09892 Supervision of other high risk pregnancies, second trimester: Secondary | ICD-10-CM

## 2013-02-21 DIAGNOSIS — O09219 Supervision of pregnancy with history of pre-term labor, unspecified trimester: Secondary | ICD-10-CM

## 2013-02-21 DIAGNOSIS — Z331 Pregnant state, incidental: Secondary | ICD-10-CM

## 2013-02-21 DIAGNOSIS — O09299 Supervision of pregnancy with other poor reproductive or obstetric history, unspecified trimester: Secondary | ICD-10-CM

## 2013-02-21 DIAGNOSIS — O99019 Anemia complicating pregnancy, unspecified trimester: Secondary | ICD-10-CM

## 2013-02-21 DIAGNOSIS — O3430 Maternal care for cervical incompetence, unspecified trimester: Secondary | ICD-10-CM

## 2013-02-21 DIAGNOSIS — Z1389 Encounter for screening for other disorder: Secondary | ICD-10-CM

## 2013-02-21 LAB — POCT URINALYSIS DIPSTICK
Blood, UA: NEGATIVE
Glucose, UA: NEGATIVE

## 2013-02-21 NOTE — Addendum Note (Signed)
Addended by: Richardson Chiquito on: 02/21/2013 10:25 AM   Modules accepted: Orders

## 2013-02-21 NOTE — Progress Notes (Signed)
U/S(16+4wks)-active vertex fetus, posterior Gr 0 placenta, fluid WNL, C.L. On Rt ovary decreased to 2.0cm, CX=2.7cm closed, no funneling noted (measured vaginally), female fetus

## 2013-02-24 ENCOUNTER — Ambulatory Visit: Payer: BC Managed Care – PPO | Admitting: Obstetrics and Gynecology

## 2013-02-26 LAB — MATERNAL SCREEN, INTEGRATED #2
AFP, Serum: 40.8 ng/mL
Age risk Down Syndrome: 1:1100 {titer}
Calculated Gestational Age: 16.9
Inhibin A Dimeric: 177 pg/mL
Inhibin A MoM: 0.99
MSS Trisomy 18 Risk: <1:5000 {titer}
NT MoM: 1.06
Number of fetuses: 1
PAPP-A MoM: 0.58
Rish for ONTD: <1:5000 {titer}

## 2013-03-10 ENCOUNTER — Other Ambulatory Visit: Payer: Self-pay | Admitting: Obstetrics & Gynecology

## 2013-03-10 ENCOUNTER — Ambulatory Visit (INDEPENDENT_AMBULATORY_CARE_PROVIDER_SITE_OTHER): Payer: BC Managed Care – PPO

## 2013-03-10 ENCOUNTER — Encounter: Payer: Self-pay | Admitting: Obstetrics & Gynecology

## 2013-03-10 ENCOUNTER — Ambulatory Visit (INDEPENDENT_AMBULATORY_CARE_PROVIDER_SITE_OTHER): Payer: BC Managed Care – PPO | Admitting: Obstetrics & Gynecology

## 2013-03-10 VITALS — BP 100/50 | Wt 148.0 lb

## 2013-03-10 DIAGNOSIS — O3430 Maternal care for cervical incompetence, unspecified trimester: Secondary | ICD-10-CM

## 2013-03-10 DIAGNOSIS — Z331 Pregnant state, incidental: Secondary | ICD-10-CM

## 2013-03-10 DIAGNOSIS — Z1389 Encounter for screening for other disorder: Secondary | ICD-10-CM

## 2013-03-10 DIAGNOSIS — O09299 Supervision of pregnancy with other poor reproductive or obstetric history, unspecified trimester: Secondary | ICD-10-CM

## 2013-03-10 DIAGNOSIS — Z8751 Personal history of pre-term labor: Secondary | ICD-10-CM

## 2013-03-10 DIAGNOSIS — O09219 Supervision of pregnancy with history of pre-term labor, unspecified trimester: Secondary | ICD-10-CM

## 2013-03-10 DIAGNOSIS — O09892 Supervision of other high risk pregnancies, second trimester: Secondary | ICD-10-CM

## 2013-03-10 DIAGNOSIS — O99019 Anemia complicating pregnancy, unspecified trimester: Secondary | ICD-10-CM

## 2013-03-10 LAB — POCT URINALYSIS DIPSTICK
Blood, UA: NEGATIVE
Nitrite, UA: NEGATIVE
Protein, UA: NEGATIVE

## 2013-03-10 MED ORDER — HYDROXYPROGESTERONE CAPROATE 250 MG/ML IM OIL
250.0000 mg | TOPICAL_OIL | Freq: Once | INTRAMUSCULAR | Status: AC
  Administered 2013-03-10: 250 mg via INTRAMUSCULAR

## 2013-03-10 NOTE — Progress Notes (Signed)
U/S(19+0wks)-active fetus, meas c/w dates, fluid wnl, posterior gr 0 placenta, no major abnl noted, female fetus, FHR-138 bpm, cx-3.0cm long and closed no change noted with fundal pressure applied (measured vaginally)

## 2013-03-10 NOTE — Progress Notes (Signed)
Sonogram reviewed and report done.  cervical length is stable and there is no dynamic change  BP weight and urine results all reviewed and noted. Patient reports good fetal movement, denies any bleeding and no rupture of membranes symptoms or regular contractions. Patient is without complaints. All questions were answered.  17P begun today, follow up weekly, q 2 weeks cervical length through 28 weeks and continue prometrium vaginally

## 2013-03-17 ENCOUNTER — Ambulatory Visit: Payer: BC Managed Care – PPO | Admitting: Adult Health

## 2013-03-21 ENCOUNTER — Ambulatory Visit (INDEPENDENT_AMBULATORY_CARE_PROVIDER_SITE_OTHER): Payer: BC Managed Care – PPO | Admitting: Obstetrics & Gynecology

## 2013-03-21 ENCOUNTER — Encounter: Payer: Self-pay | Admitting: Obstetrics & Gynecology

## 2013-03-21 VITALS — BP 110/50 | Ht 62.0 in | Wt 150.0 lb

## 2013-03-21 DIAGNOSIS — Z1389 Encounter for screening for other disorder: Secondary | ICD-10-CM

## 2013-03-21 DIAGNOSIS — Z331 Pregnant state, incidental: Secondary | ICD-10-CM

## 2013-03-21 DIAGNOSIS — O09219 Supervision of pregnancy with history of pre-term labor, unspecified trimester: Secondary | ICD-10-CM

## 2013-03-21 DIAGNOSIS — Z3490 Encounter for supervision of normal pregnancy, unspecified, unspecified trimester: Secondary | ICD-10-CM

## 2013-03-21 LAB — POCT URINALYSIS DIPSTICK
Blood, UA: NEGATIVE
Glucose, UA: NEGATIVE
Ketones, UA: NEGATIVE
Nitrite, UA: NEGATIVE

## 2013-03-21 MED ORDER — HYDROXYPROGESTERONE CAPROATE 250 MG/ML IM OIL
250.0000 mg | TOPICAL_OIL | Freq: Once | INTRAMUSCULAR | Status: AC
  Administered 2013-03-21: 250 mg via INTRAMUSCULAR

## 2013-03-21 NOTE — Progress Notes (Signed)
Pt here for 17P injection. Pt complaints of yeast infection. Last time she had this, she used Monistat and that cleared up yeast. Spoke with Dr. Despina Hidden who advised to continue Prometrium and can use Monistat for yeast. Pt voiced understanding. JSY

## 2013-03-25 ENCOUNTER — Encounter (INDEPENDENT_AMBULATORY_CARE_PROVIDER_SITE_OTHER): Payer: Self-pay

## 2013-03-25 ENCOUNTER — Ambulatory Visit (INDEPENDENT_AMBULATORY_CARE_PROVIDER_SITE_OTHER): Payer: BC Managed Care – PPO

## 2013-03-25 ENCOUNTER — Ambulatory Visit (INDEPENDENT_AMBULATORY_CARE_PROVIDER_SITE_OTHER): Payer: BC Managed Care – PPO | Admitting: Obstetrics & Gynecology

## 2013-03-25 ENCOUNTER — Other Ambulatory Visit: Payer: Self-pay | Admitting: Obstetrics & Gynecology

## 2013-03-25 DIAGNOSIS — O26872 Cervical shortening, second trimester: Secondary | ICD-10-CM

## 2013-03-25 DIAGNOSIS — O99019 Anemia complicating pregnancy, unspecified trimester: Secondary | ICD-10-CM

## 2013-03-25 DIAGNOSIS — O09299 Supervision of pregnancy with other poor reproductive or obstetric history, unspecified trimester: Secondary | ICD-10-CM

## 2013-03-25 DIAGNOSIS — Z8751 Personal history of pre-term labor: Secondary | ICD-10-CM

## 2013-03-25 DIAGNOSIS — O26879 Cervical shortening, unspecified trimester: Secondary | ICD-10-CM

## 2013-03-25 DIAGNOSIS — O3430 Maternal care for cervical incompetence, unspecified trimester: Secondary | ICD-10-CM

## 2013-03-25 DIAGNOSIS — Z1389 Encounter for screening for other disorder: Secondary | ICD-10-CM

## 2013-03-25 DIAGNOSIS — O09219 Supervision of pregnancy with history of pre-term labor, unspecified trimester: Secondary | ICD-10-CM

## 2013-03-25 DIAGNOSIS — Z331 Pregnant state, incidental: Secondary | ICD-10-CM

## 2013-03-25 LAB — POCT URINALYSIS DIPSTICK
Blood, UA: NEGATIVE
Glucose, UA: NEGATIVE
Nitrite, UA: NEGATIVE

## 2013-03-25 NOTE — Progress Notes (Signed)
Sonogram reviewed and report done, stable cervical architecture with dynamic provocation, length 2.46 cm BP weight and urine results all reviewed and noted. Patient reports good fetal movement, denies any bleeding and no rupture of membranes symptoms or regular contractions. Patient is without complaints. All questions were answered. Will continue 17P as well and q2week cervical assessments

## 2013-03-25 NOTE — Progress Notes (Signed)
U/S(21+1wks)-transvaginal u/s performed, (limited for cervical length)-vtx active fetus, FHR-148 bpm, cx-2.4cm closed, no funneling noted, with no change noted with fundal pressure applied, posterior Gr 0 placenta, female fetus

## 2013-03-27 ENCOUNTER — Ambulatory Visit (INDEPENDENT_AMBULATORY_CARE_PROVIDER_SITE_OTHER): Payer: BC Managed Care – PPO | Admitting: Obstetrics & Gynecology

## 2013-03-27 ENCOUNTER — Encounter: Payer: Self-pay | Admitting: Obstetrics & Gynecology

## 2013-03-27 VITALS — BP 102/50 | Ht 62.0 in | Wt 151.2 lb

## 2013-03-27 DIAGNOSIS — O09219 Supervision of pregnancy with history of pre-term labor, unspecified trimester: Secondary | ICD-10-CM

## 2013-03-27 DIAGNOSIS — Z331 Pregnant state, incidental: Secondary | ICD-10-CM

## 2013-03-27 DIAGNOSIS — Z8751 Personal history of pre-term labor: Secondary | ICD-10-CM

## 2013-03-27 DIAGNOSIS — Z1389 Encounter for screening for other disorder: Secondary | ICD-10-CM

## 2013-03-27 DIAGNOSIS — Z3490 Encounter for supervision of normal pregnancy, unspecified, unspecified trimester: Secondary | ICD-10-CM

## 2013-03-27 LAB — POCT URINALYSIS DIPSTICK
Blood, UA: NEGATIVE
Glucose, UA: NEGATIVE
Ketones, UA: NEGATIVE
Leukocytes, UA: NEGATIVE

## 2013-03-27 MED ORDER — HYDROXYPROGESTERONE CAPROATE 250 MG/ML IM OIL
250.0000 mg | TOPICAL_OIL | Freq: Once | INTRAMUSCULAR | Status: AC
  Administered 2013-03-27: 250 mg via INTRAMUSCULAR

## 2013-03-27 NOTE — Progress Notes (Signed)
Patient ID: Tonya Burns, female   DOB: [birth year], 22 y.o.   MRN: [id number] Pt here today for 17P, was given in lt deltoid with no complications.  Pt has her next appointment scheduled for next week.

## 2013-04-02 ENCOUNTER — Encounter: Payer: Self-pay | Admitting: Obstetrics and Gynecology

## 2013-04-02 ENCOUNTER — Ambulatory Visit (INDEPENDENT_AMBULATORY_CARE_PROVIDER_SITE_OTHER): Payer: BC Managed Care – PPO | Admitting: Obstetrics and Gynecology

## 2013-04-02 VITALS — BP 104/76 | Ht 62.0 in | Wt 153.0 lb

## 2013-04-02 DIAGNOSIS — Z331 Pregnant state, incidental: Secondary | ICD-10-CM

## 2013-04-02 DIAGNOSIS — O09219 Supervision of pregnancy with history of pre-term labor, unspecified trimester: Secondary | ICD-10-CM

## 2013-04-02 DIAGNOSIS — Z1389 Encounter for screening for other disorder: Secondary | ICD-10-CM

## 2013-04-02 DIAGNOSIS — O09892 Supervision of other high risk pregnancies, second trimester: Secondary | ICD-10-CM

## 2013-04-02 DIAGNOSIS — Z3490 Encounter for supervision of normal pregnancy, unspecified, unspecified trimester: Secondary | ICD-10-CM

## 2013-04-02 LAB — POCT URINALYSIS DIPSTICK
Blood, UA: NEGATIVE
Ketones, UA: NEGATIVE
Leukocytes, UA: NEGATIVE
Nitrite, UA: NEGATIVE
Protein, UA: NEGATIVE

## 2013-04-02 MED ORDER — HYDROXYPROGESTERONE CAPROATE 250 MG/ML IM OIL
250.0000 mg | TOPICAL_OIL | Freq: Once | INTRAMUSCULAR | Status: AC
  Administered 2013-04-02: 250 mg via INTRAMUSCULAR

## 2013-04-02 NOTE — Progress Notes (Signed)
Patient ID: Tonya Burns, female   DOB: March 13, 1991, 22 y.o.   MRN: [id number] Pt here today for 17P injection. Pt states that she has good fetal movement and denies any problems or concerns at this time.

## 2013-04-09 ENCOUNTER — Encounter: Payer: Self-pay | Admitting: Advanced Practice Midwife

## 2013-04-09 ENCOUNTER — Encounter (INDEPENDENT_AMBULATORY_CARE_PROVIDER_SITE_OTHER): Payer: Self-pay

## 2013-04-09 ENCOUNTER — Other Ambulatory Visit: Payer: Self-pay | Admitting: Obstetrics & Gynecology

## 2013-04-09 ENCOUNTER — Ambulatory Visit (INDEPENDENT_AMBULATORY_CARE_PROVIDER_SITE_OTHER): Payer: BC Managed Care – PPO | Admitting: Advanced Practice Midwife

## 2013-04-09 ENCOUNTER — Ambulatory Visit (INDEPENDENT_AMBULATORY_CARE_PROVIDER_SITE_OTHER): Payer: BC Managed Care – PPO

## 2013-04-09 VITALS — BP 104/50 | Wt 153.0 lb

## 2013-04-09 DIAGNOSIS — O09219 Supervision of pregnancy with history of pre-term labor, unspecified trimester: Secondary | ICD-10-CM

## 2013-04-09 DIAGNOSIS — O3430 Maternal care for cervical incompetence, unspecified trimester: Secondary | ICD-10-CM

## 2013-04-09 DIAGNOSIS — O26872 Cervical shortening, second trimester: Secondary | ICD-10-CM

## 2013-04-09 DIAGNOSIS — O26879 Cervical shortening, unspecified trimester: Secondary | ICD-10-CM

## 2013-04-09 DIAGNOSIS — O09899 Supervision of other high risk pregnancies, unspecified trimester: Secondary | ICD-10-CM

## 2013-04-09 DIAGNOSIS — Z331 Pregnant state, incidental: Secondary | ICD-10-CM

## 2013-04-09 DIAGNOSIS — Z1389 Encounter for screening for other disorder: Secondary | ICD-10-CM

## 2013-04-09 LAB — POCT URINALYSIS DIPSTICK
Blood, UA: NEGATIVE
Glucose, UA: NEGATIVE
Nitrite, UA: NEGATIVE

## 2013-04-09 MED ORDER — HYDROXYPROGESTERONE CAPROATE 250 MG/ML IM OIL
250.0000 mg | TOPICAL_OIL | Freq: Once | INTRAMUSCULAR | Status: AC
  Administered 2013-04-09: 250 mg via INTRAMUSCULAR

## 2013-04-09 NOTE — Progress Notes (Signed)
No change in cervix via TVUS.  No c/o at this time.  Routine questions about pregnancy answered.  F/U in 1 weeks for 17p and 2 weeks TVUS.

## 2013-04-09 NOTE — Progress Notes (Signed)
U/S(23+2wks)-vtx active fetus, FHR- 136bpm, cx appears closed measures 2.0cm no change or funneling noted with fundal pressure applied (measured vaginally), posterior Gr 1 placenta, female fetus

## 2013-04-10 NOTE — L&D Delivery Note (Signed)
Attestation of Attending Supervision of Advanced Practitioner (CNM/NP): Evaluation and management procedures were performed by the Advanced Practitioner under my supervision and collaboration.  I have reviewed the Advanced Practitioner's note and chart, and I agree with the management and plan.  HARRAWAY-SMITH, Edith Groleau 6:23 PM     

## 2013-04-10 NOTE — L&D Delivery Note (Signed)
Operative Delivery Note At 12:52 PM a healthy female was delivered via Vaginal, Spontaneous Delivery.  Presentation: vertex; Position: Occiput,, Anterior; Station: +3.  End stage bradycardia 80's-90's x approx. 5 min. Verbal consent: obtained from patient.  Risks and benefits discussed in detail.  Risks include, but are not limited to the risks of anesthesia, bleeding, infection, damage to maternal tissues, fetal cephalhematoma.  There is also the risk of inability to effect vaginal delivery of the head, or shoulder dystocia that cannot be resolved by established maneuvers, leading to the need for emergency cesarean section. (Did not explain risk of emergent C/S). 1 pull effected delivery and vacuum removed as head crowned.  APGAR: 8, 8; weight .   Placenta status: Intact, Spontaneous.   Cord: 3 vessels with the following complications: None.   Anesthesia: Local  Instruments: Kiwi Episiotomy: None Lacerations: small 2nd degree Suture Repair: 3.0 vicryl rapide Est. Blood Loss (mL): 250  Mom to postpartum.  Baby to Couplet care / Skin to Skin.  Tonya Burns 07/09/2013, 1:25 PM

## 2013-04-14 ENCOUNTER — Telehealth: Payer: Self-pay | Admitting: Obstetrics and Gynecology

## 2013-04-14 NOTE — Telephone Encounter (Signed)
Pt c/o itching on neck and feet, intermittently. Encouraged pt to take benadryl if no improvement to call our office back. Pt verbalized understanding.

## 2013-04-15 ENCOUNTER — Encounter (INDEPENDENT_AMBULATORY_CARE_PROVIDER_SITE_OTHER): Payer: Self-pay

## 2013-04-15 ENCOUNTER — Encounter: Payer: Self-pay | Admitting: Obstetrics & Gynecology

## 2013-04-15 ENCOUNTER — Ambulatory Visit (INDEPENDENT_AMBULATORY_CARE_PROVIDER_SITE_OTHER): Payer: BC Managed Care – PPO | Admitting: Obstetrics & Gynecology

## 2013-04-15 VITALS — BP 122/60 | Ht 62.0 in | Wt 157.0 lb

## 2013-04-15 DIAGNOSIS — Z331 Pregnant state, incidental: Secondary | ICD-10-CM

## 2013-04-15 DIAGNOSIS — Z8751 Personal history of pre-term labor: Secondary | ICD-10-CM

## 2013-04-15 DIAGNOSIS — O09219 Supervision of pregnancy with history of pre-term labor, unspecified trimester: Secondary | ICD-10-CM

## 2013-04-15 DIAGNOSIS — Z1389 Encounter for screening for other disorder: Secondary | ICD-10-CM

## 2013-04-15 LAB — POCT URINALYSIS DIPSTICK
Glucose, UA: NEGATIVE
Ketones, UA: NEGATIVE
LEUKOCYTES UA: NEGATIVE
NITRITE UA: NEGATIVE
RBC UA: NEGATIVE

## 2013-04-15 MED ORDER — HYDROXYPROGESTERONE CAPROATE 250 MG/ML IM OIL
250.0000 mg | TOPICAL_OIL | Freq: Once | INTRAMUSCULAR | Status: AC
  Administered 2013-04-15: 250 mg via INTRAMUSCULAR

## 2013-04-15 NOTE — Progress Notes (Signed)
Patient ID: Tonya Burns, female   DOB: May 09, 1990, 23 y.o.   MRN: [id number] Pt was here today for 17P injection, injection given in rt deltoid with no complications.  Pt already has follow up appt scheduled for next week.

## 2013-04-18 ENCOUNTER — Other Ambulatory Visit: Payer: Self-pay | Admitting: Obstetrics & Gynecology

## 2013-04-18 DIAGNOSIS — O26872 Cervical shortening, second trimester: Secondary | ICD-10-CM

## 2013-04-24 ENCOUNTER — Other Ambulatory Visit: Payer: Self-pay | Admitting: Obstetrics & Gynecology

## 2013-04-24 ENCOUNTER — Ambulatory Visit (INDEPENDENT_AMBULATORY_CARE_PROVIDER_SITE_OTHER): Payer: BC Managed Care – PPO

## 2013-04-24 ENCOUNTER — Encounter: Payer: Self-pay | Admitting: Obstetrics & Gynecology

## 2013-04-24 ENCOUNTER — Encounter (INDEPENDENT_AMBULATORY_CARE_PROVIDER_SITE_OTHER): Payer: Self-pay

## 2013-04-24 ENCOUNTER — Ambulatory Visit (INDEPENDENT_AMBULATORY_CARE_PROVIDER_SITE_OTHER): Payer: BC Managed Care – PPO | Admitting: Obstetrics & Gynecology

## 2013-04-24 VITALS — BP 100/60 | Wt 159.0 lb

## 2013-04-24 DIAGNOSIS — O3430 Maternal care for cervical incompetence, unspecified trimester: Secondary | ICD-10-CM

## 2013-04-24 DIAGNOSIS — O26879 Cervical shortening, unspecified trimester: Secondary | ICD-10-CM

## 2013-04-24 DIAGNOSIS — O09212 Supervision of pregnancy with history of pre-term labor, second trimester: Secondary | ICD-10-CM

## 2013-04-24 DIAGNOSIS — O09219 Supervision of pregnancy with history of pre-term labor, unspecified trimester: Secondary | ICD-10-CM

## 2013-04-24 DIAGNOSIS — Z1389 Encounter for screening for other disorder: Secondary | ICD-10-CM

## 2013-04-24 DIAGNOSIS — O09892 Supervision of other high risk pregnancies, second trimester: Secondary | ICD-10-CM

## 2013-04-24 DIAGNOSIS — O26872 Cervical shortening, second trimester: Secondary | ICD-10-CM

## 2013-04-24 DIAGNOSIS — Z331 Pregnant state, incidental: Secondary | ICD-10-CM

## 2013-04-24 LAB — POCT URINALYSIS DIPSTICK
Blood, UA: NEGATIVE
Glucose, UA: NEGATIVE
KETONES UA: NEGATIVE
Leukocytes, UA: NEGATIVE
Nitrite, UA: NEGATIVE
Protein, UA: NEGATIVE

## 2013-04-24 MED ORDER — HYDROXYPROGESTERONE CAPROATE 250 MG/ML IM OIL
250.0000 mg | TOPICAL_OIL | Freq: Once | INTRAMUSCULAR | Status: AC
  Administered 2013-04-24: 250 mg via INTRAMUSCULAR

## 2013-04-24 NOTE — Progress Notes (Signed)
U/S(25+3wks)-active vertex fetus, FHR-138 bpm, fluid wnl,posterior Gr 0 placenta, EFW 2 lb (62nd%tile), Cx-1.5cm closed with small amount of funneling noted (measured vaginally) (decreased since 04/09/2013 u/s)

## 2013-04-24 NOTE — Addendum Note (Signed)
Addended by: Criss AlvinePULLIAM, Morna Flud G on: 04/24/2013 10:08 AM   Modules accepted: Orders

## 2013-04-24 NOTE — Progress Notes (Signed)
Sonogram reviewed and report done.   Some dynamic changes now to the cervix Recheck in 2 weeks and if still changing consider procardia as well BP weight and urine results all reviewed and noted. Patient reports good fetal movement, denies any bleeding and no rupture of membranes symptoms or regular contractions. Patient is without complaints. All questions were answered.

## 2013-04-30 ENCOUNTER — Encounter: Payer: Self-pay | Admitting: Adult Health

## 2013-04-30 ENCOUNTER — Ambulatory Visit (INDEPENDENT_AMBULATORY_CARE_PROVIDER_SITE_OTHER): Payer: BC Managed Care – PPO | Admitting: Adult Health

## 2013-04-30 VITALS — BP 100/60 | Ht 62.0 in | Wt 161.0 lb

## 2013-04-30 DIAGNOSIS — Z8751 Personal history of pre-term labor: Secondary | ICD-10-CM

## 2013-04-30 DIAGNOSIS — Z3490 Encounter for supervision of normal pregnancy, unspecified, unspecified trimester: Secondary | ICD-10-CM

## 2013-04-30 DIAGNOSIS — O09219 Supervision of pregnancy with history of pre-term labor, unspecified trimester: Secondary | ICD-10-CM

## 2013-04-30 MED ORDER — HYDROXYPROGESTERONE CAPROATE 250 MG/ML IM OIL
250.0000 mg | TOPICAL_OIL | Freq: Once | INTRAMUSCULAR | Status: AC
  Administered 2013-04-30: 250 mg via INTRAMUSCULAR

## 2013-04-30 NOTE — Progress Notes (Signed)
Patient ID: Tonya Burns, female   DOB: 06-12-1990, 23 y.o.   MRN: [id number] Pt given 17 P injection,1 ML, right deltoid, no complications. Pt does c/o of "alot of pressure when she works a 40 hour work week." Per Joellyn HaffKim Booker, CNM ok to give note stating pt to work 35 hours per week until delivery. Work note given to pt.

## 2013-05-06 ENCOUNTER — Other Ambulatory Visit: Payer: Self-pay | Admitting: Obstetrics & Gynecology

## 2013-05-06 ENCOUNTER — Ambulatory Visit (INDEPENDENT_AMBULATORY_CARE_PROVIDER_SITE_OTHER): Payer: BC Managed Care – PPO

## 2013-05-06 ENCOUNTER — Other Ambulatory Visit: Payer: BC Managed Care – PPO

## 2013-05-06 ENCOUNTER — Ambulatory Visit (INDEPENDENT_AMBULATORY_CARE_PROVIDER_SITE_OTHER): Payer: BC Managed Care – PPO | Admitting: Obstetrics & Gynecology

## 2013-05-06 ENCOUNTER — Encounter: Payer: Self-pay | Admitting: Obstetrics & Gynecology

## 2013-05-06 VITALS — BP 100/60 | Wt 162.0 lb

## 2013-05-06 DIAGNOSIS — O26872 Cervical shortening, second trimester: Secondary | ICD-10-CM

## 2013-05-06 DIAGNOSIS — O09212 Supervision of pregnancy with history of pre-term labor, second trimester: Principal | ICD-10-CM

## 2013-05-06 DIAGNOSIS — O26849 Uterine size-date discrepancy, unspecified trimester: Secondary | ICD-10-CM

## 2013-05-06 DIAGNOSIS — Z3480 Encounter for supervision of other normal pregnancy, unspecified trimester: Secondary | ICD-10-CM

## 2013-05-06 DIAGNOSIS — O09892 Supervision of other high risk pregnancies, second trimester: Secondary | ICD-10-CM

## 2013-05-06 DIAGNOSIS — O09219 Supervision of pregnancy with history of pre-term labor, unspecified trimester: Secondary | ICD-10-CM

## 2013-05-06 DIAGNOSIS — O99019 Anemia complicating pregnancy, unspecified trimester: Secondary | ICD-10-CM

## 2013-05-06 DIAGNOSIS — O26879 Cervical shortening, unspecified trimester: Secondary | ICD-10-CM

## 2013-05-06 DIAGNOSIS — Z331 Pregnant state, incidental: Secondary | ICD-10-CM

## 2013-05-06 DIAGNOSIS — Z8751 Personal history of pre-term labor: Secondary | ICD-10-CM

## 2013-05-06 DIAGNOSIS — Z1389 Encounter for screening for other disorder: Secondary | ICD-10-CM

## 2013-05-06 LAB — CBC
HEMATOCRIT: 32.9 % — AB (ref 36.0–46.0)
Hemoglobin: 10.9 g/dL — ABNORMAL LOW (ref 12.0–15.0)
MCH: 29.1 pg (ref 26.0–34.0)
MCHC: 33.1 g/dL (ref 30.0–36.0)
MCV: 88 fL (ref 78.0–100.0)
Platelets: 189 10*3/uL (ref 150–400)
RBC: 3.74 MIL/uL — ABNORMAL LOW (ref 3.87–5.11)
RDW: 14 % (ref 11.5–15.5)
WBC: 10.3 10*3/uL (ref 4.0–10.5)

## 2013-05-06 LAB — POCT URINALYSIS DIPSTICK
Blood, UA: NEGATIVE
Glucose, UA: NEGATIVE
KETONES UA: NEGATIVE
Leukocytes, UA: NEGATIVE
Nitrite, UA: NEGATIVE
Protein, UA: NEGATIVE

## 2013-05-06 MED ORDER — HYDROXYPROGESTERONE CAPROATE 250 MG/ML IM OIL
250.0000 mg | TOPICAL_OIL | Freq: Once | INTRAMUSCULAR | Status: AC
  Administered 2013-05-06: 250 mg via INTRAMUSCULAR

## 2013-05-06 NOTE — Progress Notes (Signed)
See sonogram report, cervix essentially stable. On 17P + prometrium No indication for steroids at this point, pt/family understand reasoning Re check cervical length 1 more time at 29 weeks  BP weight and urine results all reviewed and noted. Patient reports good fetal movement, denies any bleeding and no rupture of membranes symptoms or regular contractions. Patient is without complaints. All questions were answered.

## 2013-05-06 NOTE — Addendum Note (Signed)
Addended by: Criss AlvinePULLIAM, CHRYSTAL G on: 05/06/2013 10:01 AM   Modules accepted: Orders

## 2013-05-06 NOTE — Addendum Note (Signed)
Addended by: Richardson ChiquitoRAVIS, Lamontae Ricardo M on: 05/06/2013 10:10 AM   Modules accepted: Orders

## 2013-05-06 NOTE — Progress Notes (Signed)
U/S(27+1wks)-vtx active fetus, FHR-136bpm, posterior Gr 1 placenta, cx appears closed(measured vaginally), with small funnel noted, cx=1.3 & 1.4cm with funnel measuring 1.8(length) x 1.3(width)cm, female fetus, fluid wnl

## 2013-05-07 LAB — HSV 2 ANTIBODY, IGG: HSV 2 Glycoprotein G Ab, IgG: 13.11 IV — ABNORMAL HIGH

## 2013-05-07 LAB — GLUCOSE TOLERANCE, 2 HOURS W/ 1HR
GLUCOSE, FASTING: 65 mg/dL — AB (ref 70–99)
Glucose, 1 hour: 91 mg/dL (ref 70–170)

## 2013-05-07 LAB — HIV ANTIBODY (ROUTINE TESTING W REFLEX): HIV: NONREACTIVE

## 2013-05-07 LAB — ANTIBODY SCREEN: Antibody Screen: NEGATIVE

## 2013-05-07 LAB — RPR

## 2013-05-09 ENCOUNTER — Inpatient Hospital Stay (HOSPITAL_COMMUNITY)
Admission: AD | Admit: 2013-05-09 | Discharge: 2013-05-09 | Disposition: A | Discharge status: Home / Self Care | Payer: Medicaid Other | Source: Ambulatory Visit | Attending: Obstetrics & Gynecology | Admitting: Obstetrics & Gynecology

## 2013-05-09 ENCOUNTER — Encounter (HOSPITAL_COMMUNITY): Payer: Self-pay

## 2013-05-09 ENCOUNTER — Telehealth: Payer: Self-pay

## 2013-05-09 DIAGNOSIS — B3731 Acute candidiasis of vulva and vagina: Secondary | ICD-10-CM | POA: Insufficient documentation

## 2013-05-09 DIAGNOSIS — O26899 Other specified pregnancy related conditions, unspecified trimester: Secondary | ICD-10-CM

## 2013-05-09 DIAGNOSIS — O09892 Supervision of other high risk pregnancies, second trimester: Secondary | ICD-10-CM

## 2013-05-09 DIAGNOSIS — O26879 Cervical shortening, unspecified trimester: Secondary | ICD-10-CM

## 2013-05-09 DIAGNOSIS — Z3490 Encounter for supervision of normal pregnancy, unspecified, unspecified trimester: Secondary | ICD-10-CM

## 2013-05-09 DIAGNOSIS — B373 Candidiasis of vulva and vagina: Secondary | ICD-10-CM | POA: Insufficient documentation

## 2013-05-09 DIAGNOSIS — N970 Female infertility associated with anovulation: Secondary | ICD-10-CM

## 2013-05-09 DIAGNOSIS — O2390 Unspecified genitourinary tract infection in pregnancy, unspecified trimester: Secondary | ICD-10-CM | POA: Insufficient documentation

## 2013-05-09 DIAGNOSIS — N898 Other specified noninflammatory disorders of vagina: Secondary | ICD-10-CM

## 2013-05-09 DIAGNOSIS — O09212 Supervision of pregnancy with history of pre-term labor, second trimester: Secondary | ICD-10-CM

## 2013-05-09 DIAGNOSIS — O26872 Cervical shortening, second trimester: Secondary | ICD-10-CM

## 2013-05-09 DIAGNOSIS — Z87891 Personal history of nicotine dependence: Secondary | ICD-10-CM | POA: Insufficient documentation

## 2013-05-09 LAB — WET PREP, GENITAL
Clue Cells Wet Prep HPF POC: NONE SEEN
Trich, Wet Prep: NONE SEEN
Yeast Wet Prep HPF POC: NONE SEEN

## 2013-05-09 LAB — OB RESULTS CONSOLE GC/CHLAMYDIA
Chlamydia: NEGATIVE
GC PROBE AMP, GENITAL: NEGATIVE

## 2013-05-09 LAB — AMNISURE RUPTURE OF MEMBRANE (ROM) NOT AT ARMC: AMNISURE: NEGATIVE

## 2013-05-09 NOTE — MAU Note (Signed)
Pt presents complaining of possible ROM. States she went to the bathroom at work and found her underwear and pad wet with a continuous trickle of water. Denies vaginal bleeding and reports good fetal movement.

## 2013-05-09 NOTE — Discharge Instructions (Signed)
Second Trimester of Pregnancy The second trimester is from week 13 through week 28, months 4 through 6. The second trimester is often a time when you feel your best. Your body has also adjusted to being pregnant, and you begin to feel better physically. Usually, morning sickness has lessened or quit completely, you may have more energy, and you may have an increase in appetite. The second trimester is also a time when the fetus is growing rapidly. At the end of the sixth month, the fetus is about 9 inches long and weighs about 1 pounds. You will likely begin to feel the baby move (quickening) between 18 and 20 weeks of the pregnancy. BODY CHANGES Your body goes through many changes during pregnancy. The changes vary from woman to woman.   Your weight will continue to increase. You will notice your lower abdomen bulging out.  You may begin to get stretch marks on your hips, abdomen, and breasts.  You may develop headaches that can be relieved by medicines approved by your caregiver.  You may urinate more often because the fetus is pressing on your bladder.  You may develop or continue to have heartburn as a result of your pregnancy.  You may develop constipation because certain hormones are causing the muscles that push waste through your intestines to slow down.  You may develop hemorrhoids or swollen, bulging veins (varicose veins).  You may have back pain because of the weight gain and pregnancy hormones relaxing your joints between the bones in your pelvis and as a result of a shift in weight and the muscles that support your balance.  Your breasts will continue to grow and be tender.  Your gums may bleed and may be sensitive to brushing and flossing.  Dark spots or blotches (chloasma, mask of pregnancy) may develop on your face. This will likely fade after the baby is born.  A dark line from your belly button to the pubic area (linea nigra) may appear. This will likely fade after the  baby is born. WHAT TO EXPECT AT YOUR PRENATAL VISITS During a routine prenatal visit:  You will be weighed to make sure you and the fetus are growing normally.  Your blood pressure will be taken.  Your abdomen will be measured to track your baby's growth.  The fetal heartbeat will be listened to.  Any test results from the previous visit will be discussed. Your caregiver may ask you:  How you are feeling.  If you are feeling the baby move.  If you have had any abnormal symptoms, such as leaking fluid, bleeding, severe headaches, or abdominal cramping.  If you have any questions. Other tests that may be performed during your second trimester include:  Blood tests that check for:  Low iron levels (anemia).  Gestational diabetes (between 24 and 28 weeks).  Rh antibodies.  Urine tests to check for infections, diabetes, or protein in the urine.  An ultrasound to confirm the proper growth and development of the baby.  An amniocentesis to check for possible genetic problems.  Fetal screens for spina bifida and Down syndrome. HOME CARE INSTRUCTIONS   Avoid all smoking, herbs, alcohol, and unprescribed drugs. These chemicals affect the formation and growth of the baby.  Follow your caregiver's instructions regarding medicine use. There are medicines that are either safe or unsafe to take during pregnancy.  Exercise only as directed by your caregiver. Experiencing uterine cramps is a good sign to stop exercising.  Continue to eat regular,   healthy meals.  Wear a good support bra for breast tenderness.  Do not use hot tubs, steam rooms, or saunas.  Wear your seat belt at all times when driving.  Avoid raw meat, uncooked cheese, cat litter boxes, and soil used by cats. These carry germs that can cause birth defects in the baby.  Take your prenatal vitamins.  Try taking a stool softener (if your caregiver approves) if you develop constipation. Eat more high-fiber foods,  such as fresh vegetables or fruit and whole grains. Drink plenty of fluids to keep your urine clear or pale yellow.  Take warm sitz baths to soothe any pain or discomfort caused by hemorrhoids. Use hemorrhoid cream if your caregiver approves.  If you develop varicose veins, wear support hose. Elevate your feet for 15 minutes, 3 4 times a day. Limit salt in your diet.  Avoid heavy lifting, wear low heel shoes, and practice good posture.  Rest with your legs elevated if you have leg cramps or low back pain.  Visit your dentist if you have not gone yet during your pregnancy. Use a soft toothbrush to brush your teeth and be gentle when you floss.  A sexual relationship may be continued unless your caregiver directs you otherwise.  Continue to go to all your prenatal visits as directed by your caregiver. SEEK MEDICAL CARE IF:   You have dizziness.  You have mild pelvic cramps, pelvic pressure, or nagging pain in the abdominal area.  You have persistent nausea, vomiting, or diarrhea.  You have a bad smelling vaginal discharge.  You have pain with urination. SEEK IMMEDIATE MEDICAL CARE IF:   You have a fever.  You are leaking fluid from your vagina.  You have spotting or bleeding from your vagina.  You have severe abdominal cramping or pain.  You have rapid weight gain or loss.  You have shortness of breath with chest pain.  You notice sudden or extreme swelling of your face, hands, ankles, feet, or legs.  You have not felt your baby move in over an hour.  You have severe headaches that do not go away with medicine.  You have vision changes. Document Released: 03/21/2001 Document Revised: 11/27/2012 Document Reviewed: 05/28/2012 Exit[employer] Patient Information 2014 [employer].  

## 2013-05-09 NOTE — Telephone Encounter (Signed)
Pt states that she is feeling a lot of pressure and leaking. I spoke with Dr. Despina HiddenEure about this pt and [facility] Pt advised of this and verbalized understanding.

## 2013-05-09 NOTE — MAU Provider Note (Signed)
Attestation of Attending Supervision of Fellow: Evaluation and management procedures were performed by the Fellow under my supervision and collaboration.  I have reviewed the Fellow's note and chart, and I agree with the management and plan.    

## 2013-05-09 NOTE — MAU Provider Note (Signed)
  History     CSN: [id number]  Arrival date and time: 05/09/13 1338   First Provider Initiated Contact with Patient 05/09/13 1434      Chief Complaint  Patient presents with  . Vaginal Discharge   HPI  23 y.o.G2P0100 at 27.4 by US  with PMH of PTL with fetal demise @22  weeks presents today for LOF at work which occurred earlier today. She stands at work all day at [employer]. She reports the fluid soaked her underwear and pants.  Denis VB.   Reports good FM. Reports yeast infection currently which she believes is due to the vaginal progesterone she is using. She also gets IM 17P.  ROS Denies Fever, recent illness, trauma Denies sore throat, runny nose Denies Cough, CP Denies Vomiting, diarrhea  Past Medical History  Diagnosis Date  . Pregnant   . Pregnant 12/30/2012    History reviewed. No pertinent past surgical history.  Family History  Problem Relation Age of Onset  . Hypertension Father     History  Substance Use Topics  . Smoking status: Former Smoker    Types: Cigarettes  . Smokeless tobacco: Never Used  . Alcohol Use: No    Allergies:  Allergies  Allergen Reactions  . Tomato     Prescriptions prior to admission  Medication Sig Dispense Refill  . HYDROXYPROGESTERONE CAPROATE IM Inject into the muscle.      . Prenatal Vit-Fe Fumarate-FA (PRENATAL MULTIVITAMIN) TABS tablet Take 1 tablet by mouth daily at 12 noon.      . progesterone (PROMETRIUM) 200 MG capsule Place 1 tablet in the vagina nightly at bedtime  30 capsule  11    ROS See HPI Physical Exam   Blood pressure 112/58, pulse 89, temperature 98.1 F (36.7 C), temperature source Oral, resp. rate 18, last menstrual period 09/20/2012.  Physical Exam  Constitutional: She is oriented to person, place, and time. She appears well-developed and well-nourished.  HENT:  Head: Normocephalic and atraumatic.  Eyes: Conjunctivae are normal.  Respiratory: Effort normal. No respiratory distress.   Genitourinary: Vagina normal.  Speculum Exam: no pooling. No blood. Clear, thick Mucous with white pea-sized white matter present.  Neurological: She is alert and oriented to person, place, and time.  No focal deficits  Psychiatric: She has a normal mood and affect. Her behavior is normal. Thought content normal.    MAU Course  Procedures - Speculum exam - Fern test - Wet prep - Amnisure  Assessment and Plan     Quincy SimmondsFeeney, Patricia L 05/09/2013, 3:05 PM   22 yo G2P0100 @ 6744w4d by US and a hx of PTD at 22 weeks and shortened cervix (3 days ago 1.8cm with funneling but not through to the vagina) on PV progesterone and 17P who presents for LOF.  States had a single episode of watery discharge that soaked through her underwear and down one of her legs. None since.  +FM. + vaginal pressure. No ctx, VB.    Exam: agree with above.   A/P  - neg pool, neg fern and neg amnisure.  - cervix closed and thick - no contractions on TOCO. Some irritability initially but since resolved.  - FWB Cat I tracing- baseline 125, mod var, +accels - reassurance given  - recommend f/u as scheduled with FT - PTL precautions discussed.

## 2013-05-13 ENCOUNTER — Ambulatory Visit (INDEPENDENT_AMBULATORY_CARE_PROVIDER_SITE_OTHER): Payer: BC Managed Care – PPO | Admitting: Obstetrics and Gynecology

## 2013-05-13 ENCOUNTER — Encounter: Payer: Self-pay | Admitting: Obstetrics and Gynecology

## 2013-05-13 VITALS — BP 108/56 | Wt 163.0 lb

## 2013-05-13 DIAGNOSIS — O09219 Supervision of pregnancy with history of pre-term labor, unspecified trimester: Secondary | ICD-10-CM

## 2013-05-13 DIAGNOSIS — Z1389 Encounter for screening for other disorder: Secondary | ICD-10-CM

## 2013-05-13 DIAGNOSIS — Z8751 Personal history of pre-term labor: Secondary | ICD-10-CM

## 2013-05-13 DIAGNOSIS — Z331 Pregnant state, incidental: Secondary | ICD-10-CM

## 2013-05-13 DIAGNOSIS — Z3490 Encounter for supervision of normal pregnancy, unspecified, unspecified trimester: Secondary | ICD-10-CM

## 2013-05-13 LAB — POCT URINALYSIS DIPSTICK
Blood, UA: NEGATIVE
GLUCOSE UA: NEGATIVE
Ketones, UA: NEGATIVE
Leukocytes, UA: NEGATIVE
NITRITE UA: NEGATIVE

## 2013-05-13 LAB — GC/CHLAMYDIA PROBE AMP
CT Probe RNA: NEGATIVE
GC Probe RNA: NEGATIVE

## 2013-05-13 MED ORDER — HYDROXYPROGESTERONE CAPROATE 250 MG/ML IM OIL
250.0000 mg | TOPICAL_OIL | Freq: Once | INTRAMUSCULAR | Status: AC
  Administered 2013-05-13: 250 mg via INTRAMUSCULAR

## 2013-05-20 ENCOUNTER — Encounter: Payer: Self-pay | Admitting: Obstetrics & Gynecology

## 2013-05-20 ENCOUNTER — Ambulatory Visit (INDEPENDENT_AMBULATORY_CARE_PROVIDER_SITE_OTHER): Payer: BC Managed Care – PPO | Admitting: Obstetrics & Gynecology

## 2013-05-20 ENCOUNTER — Ambulatory Visit (INDEPENDENT_AMBULATORY_CARE_PROVIDER_SITE_OTHER): Payer: BC Managed Care – PPO

## 2013-05-20 ENCOUNTER — Other Ambulatory Visit: Payer: BC Managed Care – PPO

## 2013-05-20 ENCOUNTER — Other Ambulatory Visit: Payer: Self-pay | Admitting: Obstetrics & Gynecology

## 2013-05-20 VITALS — BP 100/50 | Wt 164.0 lb

## 2013-05-20 DIAGNOSIS — O26872 Cervical shortening, second trimester: Secondary | ICD-10-CM

## 2013-05-20 DIAGNOSIS — O26879 Cervical shortening, unspecified trimester: Secondary | ICD-10-CM

## 2013-05-20 DIAGNOSIS — Z8751 Personal history of pre-term labor: Secondary | ICD-10-CM

## 2013-05-20 DIAGNOSIS — Z1389 Encounter for screening for other disorder: Secondary | ICD-10-CM

## 2013-05-20 DIAGNOSIS — O09299 Supervision of pregnancy with other poor reproductive or obstetric history, unspecified trimester: Secondary | ICD-10-CM

## 2013-05-20 DIAGNOSIS — O09219 Supervision of pregnancy with history of pre-term labor, unspecified trimester: Secondary | ICD-10-CM

## 2013-05-20 DIAGNOSIS — Z331 Pregnant state, incidental: Secondary | ICD-10-CM

## 2013-05-20 LAB — POCT URINALYSIS DIPSTICK
Blood, UA: NEGATIVE
Glucose, UA: NEGATIVE
Ketones, UA: NEGATIVE
LEUKOCYTES UA: NEGATIVE
NITRITE UA: NEGATIVE

## 2013-05-20 MED ORDER — NIFEDIPINE ER OSMOTIC RELEASE 30 MG PO TB24: 30.0000 mg | ORAL_TABLET | Freq: Every day | ORAL | Status: DC

## 2013-05-20 MED ORDER — BETAMETHASONE SOD PHOS & ACET 6 (3-3) MG/ML IJ SUSP
12.0000 mg | Freq: Once | INTRAMUSCULAR | Status: AC
  Administered 2013-05-20: 12 mg via INTRAMUSCULAR

## 2013-05-20 MED ORDER — HYDROXYPROGESTERONE CAPROATE 250 MG/ML IM OIL
250.0000 mg | TOPICAL_OIL | Freq: Once | INTRAMUSCULAR | Status: AC
  Administered 2013-05-20: 250 mg via INTRAMUSCULAR

## 2013-05-20 NOTE — Progress Notes (Signed)
U/S(29+1wks)-vtx active fetus, approp growth EFW 2 lb 14 oz (48th%tile), fluid wnl, posterior Gr 1 placenta, bilateral adnexa wnl, FHR-133 bpm, female fetus, CX-2.697mm with funneling noted (funnel=2.7cm width and 2.7cm length)(measured vaginally)

## 2013-05-20 NOTE — Addendum Note (Signed)
Addended by: Gaylyn RongEVANS, Ren Aspinall A on: 05/20/2013 04:48 PM   Modules accepted: Orders

## 2013-05-20 NOTE — Addendum Note (Signed)
Addended by: Richardson ChiquitoRAVIS, Elizzie Westergard M on: 05/20/2013 05:04 PM   Modules accepted: Orders

## 2013-05-21 ENCOUNTER — Ambulatory Visit (INDEPENDENT_AMBULATORY_CARE_PROVIDER_SITE_OTHER): Payer: BC Managed Care – PPO | Admitting: Obstetrics & Gynecology

## 2013-05-21 ENCOUNTER — Encounter: Payer: Self-pay | Admitting: Obstetrics & Gynecology

## 2013-05-21 DIAGNOSIS — Z331 Pregnant state, incidental: Secondary | ICD-10-CM

## 2013-05-21 DIAGNOSIS — Z1389 Encounter for screening for other disorder: Secondary | ICD-10-CM

## 2013-05-21 LAB — POCT URINALYSIS DIPSTICK
Glucose, UA: NEGATIVE
Leukocytes, UA: NEGATIVE
NITRITE UA: NEGATIVE
PROTEIN UA: NEGATIVE
RBC UA: NEGATIVE

## 2013-05-21 MED ORDER — BETAMETHASONE SOD PHOS & ACET 6 (3-3) MG/ML IJ SUSP
12.0000 mg | Freq: Once | INTRAMUSCULAR | Status: AC
  Administered 2013-05-21: 12 mg via INTRAMUSCULAR

## 2013-05-24 ENCOUNTER — Emergency Department: Payer: Self-pay | Admitting: Emergency Medicine

## 2013-05-27 ENCOUNTER — Encounter: Payer: BC Managed Care – PPO | Admitting: Obstetrics & Gynecology

## 2013-05-28 ENCOUNTER — Ambulatory Visit (INDEPENDENT_AMBULATORY_CARE_PROVIDER_SITE_OTHER): Payer: BC Managed Care – PPO | Admitting: Obstetrics & Gynecology

## 2013-05-28 ENCOUNTER — Encounter: Payer: Self-pay | Admitting: Obstetrics & Gynecology

## 2013-05-28 VITALS — BP 100/60 | Wt 165.0 lb

## 2013-05-28 DIAGNOSIS — O09219 Supervision of pregnancy with history of pre-term labor, unspecified trimester: Secondary | ICD-10-CM

## 2013-05-28 DIAGNOSIS — O26879 Cervical shortening, unspecified trimester: Secondary | ICD-10-CM

## 2013-05-28 DIAGNOSIS — O09892 Supervision of other high risk pregnancies, second trimester: Secondary | ICD-10-CM

## 2013-05-28 DIAGNOSIS — Z331 Pregnant state, incidental: Secondary | ICD-10-CM

## 2013-05-28 DIAGNOSIS — O26872 Cervical shortening, second trimester: Secondary | ICD-10-CM

## 2013-05-28 DIAGNOSIS — Z1389 Encounter for screening for other disorder: Secondary | ICD-10-CM

## 2013-05-28 DIAGNOSIS — O09212 Supervision of pregnancy with history of pre-term labor, second trimester: Secondary | ICD-10-CM

## 2013-05-28 LAB — POCT URINALYSIS DIPSTICK
Glucose, UA: NEGATIVE
KETONES UA: NEGATIVE
Leukocytes, UA: NEGATIVE
Nitrite, UA: NEGATIVE
RBC UA: NEGATIVE

## 2013-05-28 NOTE — Progress Notes (Signed)
17P injection given BP weight and urine results all reviewed and noted. Patient reports good fetal movement, denies any bleeding and no rupture of membranes symptoms or regular contractions. Patient is without complaints. All questions were answered.

## 2013-05-28 NOTE — Addendum Note (Signed)
Addended by: Colen DarlingYOUNG, Jaiveon Suppes S on: 05/28/2013 04:27 PM   Modules accepted: Orders

## 2013-06-04 ENCOUNTER — Other Ambulatory Visit: Payer: Self-pay | Admitting: Obstetrics and Gynecology

## 2013-06-04 ENCOUNTER — Ambulatory Visit (INDEPENDENT_AMBULATORY_CARE_PROVIDER_SITE_OTHER): Payer: BC Managed Care – PPO | Admitting: Adult Health

## 2013-06-04 ENCOUNTER — Encounter (INDEPENDENT_AMBULATORY_CARE_PROVIDER_SITE_OTHER): Payer: Self-pay

## 2013-06-04 ENCOUNTER — Encounter (HOSPITAL_COMMUNITY): Payer: Self-pay | Admitting: General Practice

## 2013-06-04 ENCOUNTER — Encounter: Payer: Self-pay | Admitting: Adult Health

## 2013-06-04 ENCOUNTER — Ambulatory Visit (INDEPENDENT_AMBULATORY_CARE_PROVIDER_SITE_OTHER): Payer: BC Managed Care – PPO | Admitting: Obstetrics and Gynecology

## 2013-06-04 ENCOUNTER — Inpatient Hospital Stay (HOSPITAL_COMMUNITY)
Admission: AD | Admit: 2013-06-04 | Discharge: 2013-06-23 | DRG: 781 | Disposition: A | Discharge status: Home / Self Care | Payer: BC Managed Care – PPO | Source: Ambulatory Visit | Attending: Obstetrics & Gynecology | Admitting: Obstetrics & Gynecology

## 2013-06-04 ENCOUNTER — Ambulatory Visit (INDEPENDENT_AMBULATORY_CARE_PROVIDER_SITE_OTHER): Payer: BC Managed Care – PPO

## 2013-06-04 VITALS — BP 96/60 | Wt 166.0 lb

## 2013-06-04 DIAGNOSIS — O26879 Cervical shortening, unspecified trimester: Secondary | ICD-10-CM | POA: Diagnosis present

## 2013-06-04 DIAGNOSIS — Z8751 Personal history of pre-term labor: Secondary | ICD-10-CM

## 2013-06-04 DIAGNOSIS — A6000 Herpesviral infection of urogenital system, unspecified: Secondary | ICD-10-CM | POA: Diagnosis present

## 2013-06-04 DIAGNOSIS — Z1389 Encounter for screening for other disorder: Secondary | ICD-10-CM

## 2013-06-04 DIAGNOSIS — Z331 Pregnant state, incidental: Secondary | ICD-10-CM

## 2013-06-04 DIAGNOSIS — O09213 Supervision of pregnancy with history of pre-term labor, third trimester: Secondary | ICD-10-CM

## 2013-06-04 DIAGNOSIS — Z87891 Personal history of nicotine dependence: Secondary | ICD-10-CM

## 2013-06-04 DIAGNOSIS — Z3480 Encounter for supervision of other normal pregnancy, unspecified trimester: Secondary | ICD-10-CM

## 2013-06-04 DIAGNOSIS — O3433 Maternal care for cervical incompetence, third trimester: Secondary | ICD-10-CM | POA: Diagnosis present

## 2013-06-04 DIAGNOSIS — Z3490 Encounter for supervision of normal pregnancy, unspecified, unspecified trimester: Secondary | ICD-10-CM

## 2013-06-04 DIAGNOSIS — O09893 Supervision of other high risk pregnancies, third trimester: Secondary | ICD-10-CM

## 2013-06-04 DIAGNOSIS — O26872 Cervical shortening, second trimester: Secondary | ICD-10-CM

## 2013-06-04 DIAGNOSIS — O3430 Maternal care for cervical incompetence, unspecified trimester: Principal | ICD-10-CM | POA: Diagnosis present

## 2013-06-04 DIAGNOSIS — O09219 Supervision of pregnancy with history of pre-term labor, unspecified trimester: Secondary | ICD-10-CM

## 2013-06-04 DIAGNOSIS — O98519 Other viral diseases complicating pregnancy, unspecified trimester: Secondary | ICD-10-CM | POA: Diagnosis present

## 2013-06-04 LAB — CBC
HEMATOCRIT: 32.5 % — AB (ref 36.0–46.0)
Hemoglobin: 11.1 g/dL — ABNORMAL LOW (ref 12.0–15.0)
MCH: 29.7 pg (ref 26.0–34.0)
MCHC: 34.2 g/dL (ref 30.0–36.0)
MCV: 86.9 fL (ref 78.0–100.0)
Platelets: 187 10*3/uL (ref 150–400)
RBC: 3.74 MIL/uL — AB (ref 3.87–5.11)
RDW: 13.6 % (ref 11.5–15.5)
WBC: 11.5 10*3/uL — AB (ref 4.0–10.5)

## 2013-06-04 LAB — POCT URINALYSIS DIPSTICK
Glucose, UA: NEGATIVE
Ketones, UA: NEGATIVE
Nitrite, UA: NEGATIVE
RBC UA: NEGATIVE

## 2013-06-04 LAB — RAPID URINE DRUG SCREEN, HOSP PERFORMED
Amphetamines: NOT DETECTED
Barbiturates: NOT DETECTED
Benzodiazepines: NOT DETECTED
COCAINE: NOT DETECTED
OPIATES: NOT DETECTED
TETRAHYDROCANNABINOL: NOT DETECTED

## 2013-06-04 LAB — URINALYSIS, ROUTINE W REFLEX MICROSCOPIC
Bilirubin Urine: NEGATIVE
Glucose, UA: 100 mg/dL — AB
HGB URINE DIPSTICK: NEGATIVE
Ketones, ur: NEGATIVE mg/dL
Leukocytes, UA: NEGATIVE
Nitrite: NEGATIVE
Protein, ur: NEGATIVE mg/dL
Urobilinogen, UA: 0.2 mg/dL (ref 0.0–1.0)
pH: 6 (ref 5.0–8.0)

## 2013-06-04 MED ORDER — MAGNESIUM SULFATE 40 G IN LACTATED RINGERS - SIMPLE
2.0000 g/h | INTRAVENOUS | Status: AC
  Administered 2013-06-04: 2 g/h via INTRAVENOUS
  Filled 2013-06-04: qty 500

## 2013-06-04 MED ORDER — MAGNESIUM SULFATE BOLUS VIA INFUSION
4.0000 g | Freq: Once | INTRAVENOUS | Status: AC
  Administered 2013-06-04: 4 g via INTRAVENOUS
  Filled 2013-06-04: qty 500

## 2013-06-04 MED ORDER — PRENATAL MULTIVITAMIN CH
1.0000 | ORAL_TABLET | Freq: Every day | ORAL | Status: DC
  Administered 2013-06-05 – 2013-06-22 (×18): 1 via ORAL
  Filled 2013-06-04 (×18): qty 1

## 2013-06-04 MED ORDER — LACTATED RINGERS IV SOLN
INTRAVENOUS | Status: DC
  Administered 2013-06-04: 17:00:00 via INTRAVENOUS

## 2013-06-04 MED ORDER — HYDROXYPROGESTERONE CAPROATE 250 MG/ML IM OIL
250.0000 mg | TOPICAL_OIL | INTRAMUSCULAR | Status: DC
  Administered 2013-06-11 – 2013-06-18 (×2): 250 mg via INTRAMUSCULAR
  Filled 2013-06-04 (×3): qty 1

## 2013-06-04 MED ORDER — ACETAMINOPHEN 325 MG PO TABS: 650.0000 mg | ORAL_TABLET | ORAL | Status: DC | PRN

## 2013-06-04 MED ORDER — HYDROXYPROGESTERONE CAPROATE 250 MG/ML IM OIL
250.0000 mg | TOPICAL_OIL | Freq: Once | INTRAMUSCULAR | Status: AC
  Administered 2013-06-04: 250 mg via INTRAMUSCULAR

## 2013-06-04 MED ORDER — ZOLPIDEM TARTRATE 5 MG PO TABS
5.0000 mg | ORAL_TABLET | Freq: Every evening | ORAL | Status: DC | PRN
  Administered 2013-06-04 – 2013-06-19 (×16): 5 mg via ORAL
  Filled 2013-06-04 (×17): qty 1

## 2013-06-04 MED ORDER — CALCIUM CARBONATE ANTACID 500 MG PO CHEW: 2.0000 | CHEWABLE_TABLET | ORAL | Status: DC | PRN

## 2013-06-04 MED ORDER — DOCUSATE SODIUM 100 MG PO CAPS
100.0000 mg | ORAL_CAPSULE | Freq: Every day | ORAL | Status: DC
  Administered 2013-06-05 – 2013-06-15 (×8): 100 mg via ORAL
  Filled 2013-06-04 (×12): qty 1

## 2013-06-04 MED ORDER — BETAMETHASONE SOD PHOS & ACET 6 (3-3) MG/ML IJ SUSP
12.0000 mg | INTRAMUSCULAR | Status: AC
  Administered 2013-06-04 – 2013-06-05 (×2): 12 mg via INTRAMUSCULAR
  Filled 2013-06-04 (×2): qty 2

## 2013-06-04 NOTE — Progress Notes (Signed)
Patient ID: Tonya Burns, female   DOB: January 04, 1991, 23 y.o.   MRN: [id number] Pt here for 17 P injection right deltoid, with no complications. Pt does c/o "bacterial infection" will work in to Dr. Emelda FearFerguson schedule to be evaluated.

## 2013-06-04 NOTE — Progress Notes (Signed)
  9063w2d. +3 days of constant clear vaginal discharge and nondescript odor with suppository Prometrium use and weekly 17-P injections. Denies sexual intercourse for the past few days.  Denies contractions or abd pain. Had prior pregnancy loss 22 wk, preterm labor versus cx incompetence.. Pelvic performed with pt's permission without complications or severe discomfort. Cervix is 3 to 4 cms dilated with mucus plug protruding.  Will obtain Abdominal U/S for presentation, cervical measurements. Pt to be admitted for steroids, Mag sulfate x 12 hr and probable prolonged hospitalization . GC/CHL/GBS collected. Fern test negative for ferning.   This chart was scribed by Bennett Scrapehristina Taylor, Medical Scribe, for Dr. Christin BachJohn Abbigail Anstey on 06/04/13 at 9:54 AM. This chart was reviewed by Dr. Christin BachJohn Dorothe Elmore and is accurate.

## 2013-06-04 NOTE — Progress Notes (Signed)
Patient ID: Tonya Burns, female   DOB: 30-Aug-1990, 23 y.o.   MRN: [id number] Pt seen.  Chart reviewed.  Dr. Emelda FearFerguson has already done the order and H&P. Magnesium sulfate orders written. All questions answered. [name], M.D., Evern CoreFACOG

## 2013-06-04 NOTE — H&P (Signed)
Tonya Burns is a 23 y.o. female G1 67P0100 presenting for admission at 1463w2d for preterm cervical dilation to 3.5 cm, without SROM or labor.  Pt has been having a clear mucoid d/c x 3 days.NO gush of fluid or bleeding or Abdominal pain.  Sterile speculum exam shows visible membranes at the external os to 3-4 cm, measured by trans Labial u/s as 3.6 cm dilation. Pt denies sexual activity in several weeks, is on weekly 17-P and nightly prometrium 200 mg PV, last dose last night. Pt cervix was 3.0 cm cervix length at 19 wks, gradually thinned out and funnelled, was 1.4 cm length 4 wk ago, and 2.7 cm closed and 2.7 cm funnelling at 2 wk ago u/s.   History OB History   Grav Para Term Preterm Abortions TAB SAB Ect Mult Living   2 1  1     1  0    OB HX : prior premature delivery at 22 wks after presenting to ED [facility]a [facility] for mild discomfort, exam reported as normal, but pt returned to [facility] ED 6 hr later with bulging BOW and delivered a previable infant within the ED visit.  Past Medical History  Diagnosis Date  . Pregnant   . Pregnant 12/30/2012   No past surgical history on file. Family History: family history includes Hypertension in her father. Social History:  reports that she has quit smoking. Her smoking use included Cigarettes. She smoked 0.00 packs per day. She has never used smokeless tobacco. She reports that she does not drink alcohol or use illicit drugs.   Prenatal Transfer Tool  Maternal Diabetes: No Genetic Screening: Normal Maternal Ultrasounds/Referrals: Normal, progressive shortening of cervix, prometrium added Fetal Ultrasounds or other Referrals:  None Maternal Substance Abuse:  No  Confirmatory uds ordered at admit Significant Maternal Medications:  Meds include: Other:  17-P 250 mg im weekly, prometrium Significant Maternal Lab Results:  Lab values include: Other:  GBS collected at office 2/25 Other Comments:    ROS Denies contractions or bleeding or ROM.    Last menstrual period 09/20/2012. Exam Physical Exam  Constitutional: She appears well-developed and well-nourished.  HENT:  Head: Normocephalic.  Eyes: Pupils are equal, round, and reactive to light.  Neck: Neck supple.  Cardiovascular: Normal rate and regular rhythm.   Respiratory: Effort normal.  GI: Soft.  Gravid nontender uterus c/w dates U/s done to confirm Vertex, and trans Labial u/s shows 3.6 cm BOW bulging thru cervix to even with ext margin of cervix    Prenatal labs: ABO, Rh: A/POS/-- (09/22 1255) Antibody: NEG (01/27 0827) Rubella: 1.95 (09/22 1255) RPR: NON REAC (01/27 0827)  HBsAg: NEGATIVE (09/22 1255)  HIV: NON REACTIVE (01/27 0827)  GBS:   collected 2/.25 in office  Assessment/Plan: Admit to Antenatal Ext monitoring to R/O PTL BMZ 12 mg q 24 hr ordered Needs order for Mag Sulfate for neuroprophylaxis  Remainder of admit orders ,including BMZ placed. Discussed with Dr Erin FullingHarraway Smith.   [name] V 06/04/2013, 12:43 PM

## 2013-06-04 NOTE — Progress Notes (Signed)
U/S(31+2wks)-vtx active fetus, FHR-140 bpm, EFW 3 lb 15 oz (52nd%tile), fluid wnl, CX-NO FUNCTIONAL CX NOTED ON TRANS-LABIAL U/S with 3.6cm Funnel noted, posterior gr 2 placenta, female fetus

## 2013-06-05 LAB — GC/CHLAMYDIA PROBE AMP
CT Probe RNA: NEGATIVE
GC Probe RNA: NEGATIVE

## 2013-06-05 MED ORDER — ACYCLOVIR 400 MG PO TABS
400.0000 mg | ORAL_TABLET | Freq: Three times a day (TID) | ORAL | Status: DC
  Administered 2013-06-05 – 2013-06-23 (×52): 400 mg via ORAL
  Filled 2013-06-05 (×56): qty 1

## 2013-06-05 MED ORDER — SODIUM CHLORIDE 0.9 % IJ SOLN
3.0000 mL | Freq: Two times a day (BID) | INTRAMUSCULAR | Status: DC
  Administered 2013-06-05 – 2013-06-07 (×5): 3 mL via INTRAVENOUS

## 2013-06-05 NOTE — Progress Notes (Signed)
Pt counseled about HSV2 + status.  No evidence of outbreaks.  Started acylovir 400mg  TID until delivery.

## 2013-06-05 NOTE — Progress Notes (Signed)
Neo Consult to Dr. Francine Gravenimaguila. She will come see the patient tonight or someone will come to see her in the morning.

## 2013-06-05 NOTE — Progress Notes (Signed)
Patient ID: Tonya Burns, female   DOB: [birth year], 23 y.o.   MRN: [id number] FACULTY PRACTICE ANTEPARTUM(COMPREHENSIVE) NOTE  Tonya Burns is a 23 y.o. G2P0100 at [redacted]w[redacted]d by early ultrasound who is admitted for preterm cervical dilation, on 17-P, Was on prometrium also, but have stopped this to reduce risk to BOW.   Fetal presentation is cephalic. Length of Stay:  1  Days  Subjective: NO contractions.  Patient reports the fetal movement as active. Patient reports uterine contraction  activity as none. Patient reports  vaginal bleeding as none. Patient describes fluid per vagina as None.  Vitals:  Blood pressure 91/45, pulse 87, temperature 98.3 F (36.8 C), temperature source Oral, resp. rate 16, height 5\' 2"  (1.575 m), weight 75.297 kg (166 lb), last menstrual period 09/20/2012. Physical Examination:  General appearance - alert, well appearing, and in no distress, oriented to person, place, and time and normal appearing weight Heart - normal rate and regular rhythm Abdomen - soft, nontender, nondistended Fundal Height:  size equals dates Cervical Exam: Not evaluated.  Extremities: extremities normal, atraumatic, no cyanosis or edema and Homans  sign is negative, no sign of DVT with DTRs 2+ bilaterally Membranes:intact  Fetal Monitoring:  Baseline: 145 bpm, Variability: Good {> 6 bpm), Accelerations: Reactive and Decelerations: Absent  Labs:  Results for orders placed during the hospital encounter of 06/04/13 (from the past 24 hour(s))  CBC   Collection Time    06/04/13  3:11 PM      Result Value Ref Range   WBC 11.5 (*) 4.0 - 10.5 K/uL   RBC 3.74 (*) 3.87 - 5.11 MIL/uL   Hemoglobin 11.1 (*) 12.0 - 15.0 g/dL   HCT 53.6 (*) 64.4 - 03.4 %   MCV 86.9  78.0 - 100.0 fL   MCH 29.7  26.0 - 34.0 pg   MCHC 34.2  30.0 - 36.0 g/dL   RDW 74.2  59.5 - 63.8 %   Platelets 187  150 - 400 K/uL  URINALYSIS, ROUTINE W REFLEX MICROSCOPIC   Collection Time    06/04/13  5:15 PM      Result  Value Ref Range   Color, Urine YELLOW  YELLOW   APPearance CLEAR  CLEAR   Specific Gravity, Urine >1.030 (*) 1.005 - 1.030   pH 6.0  5.0 - 8.0   Glucose, UA 100 (*) NEGATIVE mg/dL   Hgb urine dipstick NEGATIVE  NEGATIVE   Bilirubin Urine NEGATIVE  NEGATIVE   Ketones, ur NEGATIVE  NEGATIVE mg/dL   Protein, ur NEGATIVE  NEGATIVE mg/dL   Urobilinogen, UA 0.2  0.0 - 1.0 mg/dL   Nitrite NEGATIVE  NEGATIVE   Leukocytes, UA NEGATIVE  NEGATIVE  URINE RAPID DRUG SCREEN (HOSP PERFORMED)   Collection Time    06/04/13  5:15 PM      Result Value Ref Range   Opiates NONE DETECTED  NONE DETECTED   Cocaine NONE DETECTED  NONE DETECTED   Benzodiazepines NONE DETECTED  NONE DETECTED   Amphetamines NONE DETECTED  NONE DETECTED   Tetrahydrocannabinol NONE DETECTED  NONE DETECTED   Barbiturates NONE DETECTED  NONE DETECTED  POCT URINALYSIS DIPSTICK   Collection Time    06/04/13  9:39 AM      Result Value Ref Range   Color, UA gold     Clarity, UA clear     Glucose, UA neg     Bilirubin, UA       Ketones, UA neg  Spec Grav, UA       Blood, UA neg     pH, UA       Protein, UA trace     Urobilinogen, UA       Nitrite, UA neg     Leukocytes, UA Trace      Imaging Studies:     Currently EPIC will not allow sonographic studies to automatically populate into notes.  In the meantime, copy and paste results into note or free text.  Medications:  Scheduled . betamethasone acetate-betamethasone sodium phosphate  12 mg Intramuscular Q24H  . docusate sodium  100 mg Oral Daily  . hydroxyprogesterone caproate  250 mg Intramuscular Weekly  . prenatal multivitamin  1 tablet Oral Q1200   I have reviewed the patient's current medications.  ASSESSMENT: Patient Active Problem List   Diagnosis Date Noted  . Premature cervical dilation in third trimester 06/04/2013  . History of preterm delivery, currently pregnant in third trimester 06/04/2013  . Short cervix in second trimester, antepartum  05/06/2013  . History of preterm delivery, currently pregnant in second trimester 02/07/2013  . Pregnant 12/30/2012  . Infertility, anovulation 10/31/2012  . Preterm delivery, delivered 04/27/2012    PLAN: Saline lock IV Second dose BMZ today MAgnesium sulfate x 12 hours has been completed. Probable inpt care til 34 wk.  Tonya Burns V 06/05/2013,7:39 AM

## 2013-06-05 NOTE — Plan of Care (Signed)
Problem: Consults Goal: Birthing Suites Patient Information Press F2 to bring up selections list   Pt < [redacted] weeks EGA     

## 2013-06-06 LAB — CBC
HEMATOCRIT: 29.8 % — AB (ref 36.0–46.0)
Hemoglobin: 10.2 g/dL — ABNORMAL LOW (ref 12.0–15.0)
MCH: 29.7 pg (ref 26.0–34.0)
MCHC: 34.2 g/dL (ref 30.0–36.0)
MCV: 86.6 fL (ref 78.0–100.0)
Platelets: 176 10*3/uL (ref 150–400)
RBC: 3.44 MIL/uL — ABNORMAL LOW (ref 3.87–5.11)
RDW: 13.5 % (ref 11.5–15.5)
WBC: 16.7 10*3/uL — ABNORMAL HIGH (ref 4.0–10.5)

## 2013-06-06 LAB — STREP B DNA PROBE: GBSP: NEGATIVE

## 2013-06-06 NOTE — Consult Note (Signed)
Asked by Dr. Debroah LoopArnold to provide prenatal consultation for patient at risk for preterm delivery due to premature cervical dilatation.  Mother is 22y.o. G2 P0-1-0-0 who is now 7331 4/[redacted] weeks EGA.  She was admitted 2/25 for cervical shortening and dilatation, no labor, membranes intact.  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  No Hx of lesions..  Discussed usual expectations for preterm infant at 31+ weeks gestation, including possible needs for DR resuscitation, respiratory support, IV access, and tempt support.  Presented optimistic outcome with minimal risk of death or serious morbidity,  Projected possible length of stay in NICU until 36 - [redacted] wks EGA.  Discussed advantages of feeding with mother's milk, she is planning to pump postnatally and breast feed.  Patient was attentive, had appropriate questions, and was appreciative of my input.  Thank you for the consultation.  Total time 20 minutes, 15 minutes face-to-face  Aadon Gorelik E. Barrie DunkerWimmer, Jr., MD

## 2013-06-06 NOTE — Progress Notes (Signed)
Patient ID: Tonya LaosJazmine E Doggett, female   DOB: 1990/12/10, 23 y.o.   MRN: [id number] FACULTY PRACTICE ANTEPARTUM(COMPREHENSIVE) NOTE  Tonya Burns is a 23 y.o. G2P0100 at 4643w4d by early ultrasound who is admitted for Preterm labor.   Fetal presentation is cephalic. Length of Stay:  2  Days  Subjective: No contractions, has thin vaginal discharge since prior to admission Patient reports the fetal movement as active. Patient reports uterine contraction  activity as none. Patient reports  vaginal bleeding as none. Patient describes fluid per vagina as Clear.  Vitals:  Blood pressure 109/45, pulse 82, temperature 98.5 F (36.9 C), temperature source Oral, resp. rate 18, height 5\' 2"  (1.575 m), weight 166 lb (75.297 kg), last menstrual period 09/20/2012. Physical Examination:  General appearance - alert, well appearing, and in no distress Heart - normal rate and regular rhythm Abdomen - soft, nontender, nondistended Fundal Height:  size equals dates Cervical Exam: Not evaluated.  Extremities: extremities normal, atraumatic, no cyanosis or edema  Membranes:intact  Fetal Monitoring:  Baseline: 130 bpm, Variability: Good {> 6 bpm), Accelerations: Reactive and Decelerations: Absent  Labs:  No results found for this or any previous visit (from the past 24 hour(s)).  Imaging Studies:      Medications:  Scheduled . acyclovir  400 mg Oral TID  . docusate sodium  100 mg Oral Daily  . hydroxyprogesterone caproate  250 mg Intramuscular Weekly  . prenatal multivitamin  1 tablet Oral Q1200  . sodium chloride  3 mL Intravenous Q12H   I have reviewed the patient's current medications.  ASSESSMENT: Patient Active Problem List   Diagnosis Date Noted  . Premature cervical dilation in third trimester 06/04/2013  . History of preterm delivery, currently pregnant in third trimester 06/04/2013  . Short cervix in second trimester, antepartum 05/06/2013  . History of preterm delivery, currently  pregnant in second trimester 02/07/2013  . Pregnant 12/30/2012  . Infertility, anovulation 10/31/2012  . Preterm delivery, delivered 04/27/2012  HSV history  PLAN: Inpatient management due to high risk preterm birth   ARNOLD,JAMES 06/06/2013,8:05 AM

## 2013-06-07 NOTE — Progress Notes (Signed)
Patient ID: Tonya LaosJazmine E Seckman, female   DOB: [birth year], 23 y.o.   MRN: [id number] Tonya Burns is a 23 y.o. G2P0100 at 6829w5d by early ultrasound who is admitted for Preterm labor.  Fetal presentation is cephalic.  Length of Stay: 3 Days  Subjective:  No contractions, has thin vaginal discharge since prior to admission  Patient reports the fetal movement as active.  Patient reports uterine contraction activity as none.  Patient reports vaginal bleeding as none.  Patient describes fluid per vagina as Clear.  Vitals: Blood pressure 109/45, pulse 82, temperature 98.5 F (36.9 C), temperature source Oral, resp. rate 18, height 5\' 2"  (1.575 m), weight 166 lb (75.297 kg), last menstrual period 09/20/2012.  Physical Examination:  General appearance - alert, well appearing, and in no distress  Heart - normal rate and regular rhythm  Abdomen - soft, nontender, nondistended  Fundal Height: size equals dates  Cervical Exam: Not evaluated.  Extremities: extremities normal, atraumatic, no cyanosis or edema  Membranes:intact  Fetal Monitoring: Baseline: 130 bpm, Variability: Good {> 6 bpm), Accelerations: Reactive and Decelerations: Absent  Labs:  No results found for this or any previous visit (from the past 24 hour(s)).  Imaging Studies:  Medications: Scheduled  .  acyclovir  400 mg  Oral  TID   .  docusate sodium  100 mg  Oral  Daily   .  hydroxyprogesterone caproate  250 mg  Intramuscular  Weekly   .  prenatal multivitamin  1 tablet  Oral  Q1200   .  sodium chloride  3 mL  Intravenous  Q12H   I have reviewed the patient's current medications.  ASSESSMENT:  Patient Active Problem List    Diagnosis  Date Noted   .  Premature cervical dilation in third trimester  06/04/2013   .  History of preterm delivery, currently pregnant in third trimester  06/04/2013   .  Short cervix in second trimester, antepartum  05/06/2013   .  History of preterm delivery, currently pregnant in second trimester   02/07/2013   .  Pregnant  12/30/2012   .  Infertility, anovulation  10/31/2012   .  Preterm delivery, delivered  04/27/2012   HSV history  PLAN:  Inpatient management due to high risk preterm birth

## 2013-06-08 NOTE — Progress Notes (Signed)
Patient ID: Tonya Burns, female   DOB: [birth year], 23 y.o.   MRN: [id number] FACULTY PRACTICE ANTEPARTUM(COMPREHENSIVE) NOTE  Tonya Burns is a 23 y.o. G2P0100 at 8133w6d by early ultrasound who is admitted for premature cervical dilation to 3.6 cm, BOW bulging thru , visible on speculum chk ON 17-P ON WEDNESDAYS. Fetal presentation is cephalic. Length of Stay:  4  Days  Subjective: No contractions Patient reports the fetal movement as active. Patient reports uterine contraction  activity as none. Patient reports  vaginal bleeding as none. Patient describes fluid per vagina as None.  Vitals:  Blood pressure 96/40, pulse 76, temperature 98.5 F (36.9 C), temperature source Oral, resp. rate 18, height 5\' 2"  (1.575 m), weight 75.297 kg (166 lb), last menstrual period 09/20/2012. Physical Examination:  General appearance - alert, well appearing, and in no distress, normal appearing weight and well hydrated Heart - normal rate and regular rhythm Abdomen - soft, nontender, nondistended Fundal Height:  size equals dates Cervical Exam: Not evaluated.  WAS 3.6 CM ON  translabial scan in office the day of admit Extremities: extremities normal, atraumatic, no cyanosis or edema and Homans sign is negative, no sign of DVT with DTRs 2+ bilaterally Membranes:intact  Fetal Monitoring:  Baseline: 145 bpm, Variability: Good {> 6 bpm), Accelerations: Non-reactive but appropriate for gestational age and no contractions  Labs:  No results found for this or any previous visit (from the past 24 hour(s)).  Imaging Studies:     Currently EPIC will not allow sonographic studies to automatically populate into notes.  In the meantime, copy and paste results into note or free text.  Medications:  Scheduled . acyclovir  400 mg Oral TID  . docusate sodium  100 mg Oral Daily  . hydroxyprogesterone caproate  250 mg Intramuscular Weekly  . prenatal multivitamin  1 tablet Oral Q1200  . sodium chloride  3 mL  Intravenous Q12H   I have reviewed the patient's current medications.  ASSESSMENT: Patient Active Problem List   Diagnosis Date Noted  . Premature cervical dilation in third trimester 06/04/2013  . History of preterm delivery, currently pregnant in third trimester 06/04/2013  . Short cervix in second trimester, antepartum 05/06/2013  . History of preterm delivery, currently pregnant in second trimester 02/07/2013  . Pregnant 12/30/2012  . Infertility, anovulation 10/31/2012  . Preterm delivery, delivered 04/27/2012    PLAN: INpt care til 34 wk, consider d/c then.    Tonya Burns V 06/08/2013,7:47 AM

## 2013-06-09 DIAGNOSIS — O3430 Maternal care for cervical incompetence, unspecified trimester: Principal | ICD-10-CM

## 2013-06-09 DIAGNOSIS — Z029 Encounter for administrative examinations, unspecified: Secondary | ICD-10-CM

## 2013-06-09 LAB — CBC
HCT: 30.7 % — ABNORMAL LOW (ref 36.0–46.0)
Hemoglobin: 10.3 g/dL — ABNORMAL LOW (ref 12.0–15.0)
MCH: 29.4 pg (ref 26.0–34.0)
MCHC: 33.6 g/dL (ref 30.0–36.0)
MCV: 87.7 fL (ref 78.0–100.0)
PLATELETS: 165 10*3/uL (ref 150–400)
RBC: 3.5 MIL/uL — ABNORMAL LOW (ref 3.87–5.11)
RDW: 13.7 % (ref 11.5–15.5)
WBC: 14.6 10*3/uL — AB (ref 4.0–10.5)

## 2013-06-09 NOTE — Progress Notes (Signed)
Ur chart review completed.  

## 2013-06-09 NOTE — Progress Notes (Signed)
Patient ID: Tonya Burns, female   DOB: 01-07-1991, 23 y.o.   MRN: [id number] Pt discussed at round s this am with Dr. Claudean SeveranceWhitecar.  He rec discharge to home if pt stable.  Pt denies contractions or pelvic pain.   GU exam:  Cervix dilated 3-4 cm with FUNNELING past cervix of 4cm.  A advanced cervical dilation at 32 weeks with funneling of amniotic sac.  Will maintain inpt.  Explained to pt and her family.  They understand reason for inpt stauts.  Abigaile Rossie L. Harraway-Smith, M.D., Evern CoreFACOG

## 2013-06-09 NOTE — Progress Notes (Signed)
Patient ID: Tonya LaosJazmine E Leavell, female   DOB: Nov 16, 1990, 23 y.o.   MRN: [id number] FACULTY PRACTICE ANTEPARTUM(COMPREHENSIVE) NOTE  Tonya Burns is a 23 y.o. G2P0100 at 449w0d by early ultrasound who is admitted for premature cervical dilation to 3.6 cm, BBOW visible on speculum check.  ON 17-P ON WEDNESDAYS.  Fetal presentation is cephalic.  Length of Stay:  5  Days  Subjective: No contractions Patient reports the fetal movement as active. Patient reports uterine contraction  activity as none. Patient reports  vaginal bleeding as none. Patient describes fluid per vagina as None.  Vitals:  Blood pressure 107/43, pulse 78, temperature 98.9 F (37.2 C), temperature source Oral, resp. rate 18, height 5\' 2"  (1.575 m), weight 166 lb (75.297 kg), last menstrual period 09/20/2012. Physical Examination:  General appearance - alert, well appearing, and in no distress, normal appearing weight and well hydrated Heart - normal rate and regular rhythm Abdomen - soft, nontender, nondistended Fundal Height:  size equals dates Cervical Exam: Not evaluated.  WAS 3.6 CM ON  translabial scan in office the day of admit Extremities: extremities normal, atraumatic, no cyanosis or edema and Homans sign is negative, no sign of DVT with DTRs 2+ bilaterally Membranes:intact  Fetal Monitoring:  Baseline: 145 bpm, Variability: Good {> 6 bpm), Accelerations: Non-reactive but appropriate for gestational age and no contractions  Labs:  Results for orders placed during the hospital encounter of 06/04/13 (from the past 24 hour(s))  CBC   Collection Time    06/09/13  5:00 AM      Result Value Ref Range   WBC 14.6 (*) 4.0 - 10.5 K/uL   RBC 3.50 (*) 3.87 - 5.11 MIL/uL   Hemoglobin 10.3 (*) 12.0 - 15.0 g/dL   HCT 57.830.7 (*) 46.936.0 - 62.946.0 %   MCV 87.7  78.0 - 100.0 fL   MCH 29.4  26.0 - 34.0 pg   MCHC 33.6  30.0 - 36.0 g/dL   RDW 52.813.7  41.311.5 - 24.415.5 %   Platelets 165  150 - 400 K/uL    Imaging Studies:    06/04/13  U/S(31+2wks)-vtx active fetus, FHR-140 bpm, EFW 3 lb 15 oz (52nd%tile), fluid wnl, CX-NO FUNCTIONAL CX NOTED ON TRANS-LABIAL U/S with 3.6cm Funnel noted, posterior gr 2 placenta, female fetus  Medications:  Scheduled . acyclovir  400 mg Oral TID  . docusate sodium  100 mg Oral Daily  . hydroxyprogesterone caproate  250 mg Intramuscular Weekly  . prenatal multivitamin  1 tablet Oral Q1200   I have reviewed the patient's current medications.  ASSESSMENT: Patient Active Problem List   Diagnosis Date Noted  . Premature cervical dilation in third trimester 06/04/2013    Priority: High  . History of preterm delivery, currently pregnant in third trimester 04/27/2012    Priority: Medium  . Short cervix in second trimester, antepartum 05/06/2013    PLAN: Inpatient management for now due to high risk of preterm birth Routine antenatal care  Tereso NewcomerUgonna A Lilla Callejo, MD 06/09/2013,7:33 AM

## 2013-06-09 NOTE — Progress Notes (Signed)
Funneling of 3cm out of cervix

## 2013-06-10 NOTE — Progress Notes (Signed)
Pt off the monitor after reassurring FHR  

## 2013-06-10 NOTE — Progress Notes (Signed)
Patient ID: Tonya Burns, female   DOB: 08-07-1990, 23 y.o.   MRN: [id number] ACULTY PRACTICE ANTEPARTUM COMPREHENSIVE PROGRESS NOTE  Tonya Burns is a 23 y.o. G2P0100 at 2064w1d  who is admitted for Preterm labor now with funneling of amniotic sac 4 cm beyond cervix Fetal presentation is cephalic. Length of Stay:  6  Days  Subjective: Pt with no complaints. Patient reports good fetal movement.  She reports no uterine contractions, no bleeding and nosi loss of fluid per vagina.  Vitals:  Blood pressure 105/57, pulse 95, temperature 98.7 F (37.1 C), temperature source Oral, resp. rate 18, height 5\' 2"  (1.575 m), weight 166 lb (75.297 kg), last menstrual period 09/20/2012. Physical Examination: General appearance - alert, well appearing, and in no distress Abdomen - soft, nontender, nondistended, no masses or organomegaly gravid Extremities - no pedal edema noted Cervical Exam: Evaluated by digital exam (06/09/2013). and found to be 3-4cm/ 90-100% /Floating and fetal presentation is cephalic. There was funneling of the amniotic sac ~4cm BEYOND the cervical os.  Extremities: extremities normal, atraumatic, no cyanosis or edema Membranes:intact  Fetal Monitoring:  Baseline: 130's bpm and Variability: Good {> 6 bpm) Cat I  Labs:  No results found for this or any previous visit (from the past 24 hour(s)).  Imaging Studies:    None current   Medications:  Scheduled . acyclovir  400 mg Oral TID  . docusate sodium  100 mg Oral Daily  . hydroxyprogesterone caproate  250 mg Intramuscular Weekly  . prenatal multivitamin  1 tablet Oral Q1200   I have reviewed the patient's current medications.  ASSESSMENT: Patient Active Problem List   Diagnosis Date Noted  . Premature cervical dilation in third trimester 06/04/2013  . Short cervix in second trimester, antepartum 05/06/2013  . History of preterm delivery, currently pregnant in third trimester 04/27/2012    PLAN: Keep inpatient until  deliver due to excessive funneling and advance cervical dilation Continue routine antenatal care.   HARRAWAY-SMITH, Victorhugo Preis 06/10/2013,9:20 AM

## 2013-06-11 ENCOUNTER — Encounter: Payer: BC Managed Care – PPO | Admitting: Obstetrics and Gynecology

## 2013-06-11 LAB — WET PREP, GENITAL
Clue Cells Wet Prep HPF POC: NONE SEEN
Trich, Wet Prep: NONE SEEN
YEAST WET PREP: NONE SEEN

## 2013-06-11 MED ORDER — NIFEDIPINE ER 30 MG PO TB24
30.0000 mg | ORAL_TABLET | Freq: Two times a day (BID) | ORAL | Status: DC
  Administered 2013-06-11 – 2013-06-23 (×25): 30 mg via ORAL
  Filled 2013-06-11 (×25): qty 1

## 2013-06-11 NOTE — Progress Notes (Signed)
Reactive NST 

## 2013-06-11 NOTE — Progress Notes (Signed)
Antenatal Nutrition Assessment:  Currently  32 2/[redacted] weeks gestation, admitted with preterm labor. Height  62 "  Weight 167 lbs  pre-pregnancy weight 130 lbs .  Pre-pregnancy  BMI 23.8  IBW 110 lbs Total weight gain 37 lbs Weight gain goals 25-35 lbs Estimated needs: 1700-1900 kcal/day, 60-70 grams protein/day, 1.9 liters fluid/day  Regular diet tolerated well, appetite good. Current diet prescription will provide for increased needs.  No abnormal nutrition related labs  Nutrition Dx: Increased nutrient needs r/t pregnancy and fetal growth requirements aeb [redacted] weeks gestation.  No educational needs assessed at this time.  Joaquin CourtsKimberly Harris, RD, LDN, CNSC Pager [phone] After Hours Pager [phone]

## 2013-06-11 NOTE — Progress Notes (Signed)
Vaginal wet prep obtained from distal vagina due to persistent clear discharge and odor. Appears c/w mucus discharge  Adam PhenixJames G Koleton Duchemin, MD 06/11/2013 3:46 PM

## 2013-06-11 NOTE — Progress Notes (Signed)
Patient ID: Tonya Burns, female   DOB: 17-Aug-1990, 23 y.o.   MRN: [id number] ACULTY PRACTICE ANTEPARTUM COMPREHENSIVE PROGRESS NOTE  Tonya Burns is a 23 y.o. G2P0100 at 5221w2d  who is admitted for Preterm labor now with funneling of amniotic sac 4 cm beyond cervix Fetal presentation is cephalic. Length of Stay:  7  Days  Subjective: Pt with no complaints. Patient reports good fetal movement.  She reports no uterine contractions, no bleeding and nosi loss of fluid per vagina.  Vitals:  Blood pressure 111/49, pulse 88, temperature 98.9 F (37.2 C), temperature source Oral, resp. rate 16, height 5\' 2"  (1.575 m), weight 166 lb (75.297 kg), last menstrual period 09/20/2012. Physical Examination: General appearance - alert, well appearing, and in no distress Abdomen - soft, nontender, gravid Extremities - no pedal edema noted Cervical Exam: Evaluated by digital exam (06/09/2013). and found to be 3-4cm/ 90-100% /Floating and fetal presentation is cephalic. There was funneling of the amniotic sac ~4cm BEYOND the cervical os.  Extremities: extremities normal, atraumatic, no cyanosis or edema Membranes:intact  Fetal Monitoring:  Baseline: 130 bpm, Variability: Good {> 6 bpm), Accelerations: Reactive and Decelerations: Absent Cat I Toco: irregular ctx (approximately q 30 minutes) with uterine irritability Labs:  No results found for this or any previous visit (from the past 24 hour(s)).  Imaging Studies:    None current   Medications:  Scheduled . acyclovir  400 mg Oral TID  . docusate sodium  100 mg Oral Daily  . hydroxyprogesterone caproate  250 mg Intramuscular Weekly  . NIFEdipine  30 mg Oral BID  . prenatal multivitamin  1 tablet Oral Q1200   I have reviewed the patient's current medications.  ASSESSMENT: Patient Active Problem List   Diagnosis Date Noted  . Premature cervical dilation in third trimester 06/04/2013  . Short cervix in second trimester, antepartum 05/06/2013  .  History of preterm delivery, currently pregnant in third trimester 04/27/2012    PLAN: Keep inpatient until deliver due to excessive funneling and advance cervical dilation Tocolysis with procardia Continue routine antenatal care.   Krzysztof Reichelt 06/11/2013,7:10 AM

## 2013-06-12 ENCOUNTER — Ambulatory Visit (HOSPITAL_COMMUNITY): Payer: BC Managed Care – PPO

## 2013-06-12 NOTE — Progress Notes (Signed)
UR completed 

## 2013-06-12 NOTE — Progress Notes (Signed)
Patient ID: Tonya Burns, female   DOB: 06/16/1990,Tonya Burns 23 y.o.   MRN: [id number] FACULTY PRACTICE ANTEPARTUM(COMPREHENSIVE) NOTE  Tonya LaosJazmine E Burns is a 23 y.o. G2P0100 at 4156w3d by early ultrasound who is admitted for cervical dilation to 3.6 cm with BOW bulging to ext os, after pregnancy followed on 17- P for hx preterm delivery.   Fetal presentation is cephalic. Length of Stay:  8  Days  Subjective: Denies contractions or pain Patient reports the fetal movement as active. Patient reports uterine contraction  activity as none. Patient reports  vaginal bleeding as none. Patient describes fluid per vagina as None.  Vitals:  Blood pressure 111/49, pulse 100, temperature 98.5 F (36.9 C), temperature source Oral, resp. rate 18, height 5\' 2"  (1.575 m), weight 167 lb 3.2 oz (75.841 kg), last menstrual period 09/20/2012. Physical Examination:  General appearance - alert, well appearing, and in no distress Heart - normal rate and regular rhythm Abdomen - soft, nontender, nondistended Fundal Height:  size equals dates Cervical Exam: Not evaluated. An Extremities: extremities normal, atraumatic, no cyanosis or edema and Homans sign is negative, no sign of DVT with DTRs 2+ bilaterally Membranes:intact  Fetal Monitoring:  Baseline: reactive, no decels,  bpm and no contractions   Labs:  Results for orders placed during the hospital encounter of 06/04/13 (from the past 24 hour(s))  WET PREP, GENITAL   Collection Time    06/11/13  3:43 PM      Result Value Ref Range   Yeast Wet Prep HPF POC NONE SEEN  NONE SEEN   Trich, Wet Prep NONE SEEN  NONE SEEN   Clue Cells Wet Prep HPF POC NONE SEEN  NONE SEEN   WBC, Wet Prep HPF POC MODERATE (*) NONE SEEN    Imaging Studies:     Currently EPIC will not allow sonographic studies to automatically populate into notes.  In the meantime, copy and paste results into note or free text.  Medications:  Scheduled . acyclovir  400 mg Oral TID  . docusate sodium   100 mg Oral Daily  . hydroxyprogesterone caproate  250 mg Intramuscular Weekly  . NIFEdipine  30 mg Oral BID  . prenatal multivitamin  1 tablet Oral Q1200   I have reviewed the patient's current medications.  ASSESSMENT: Patient Active Problem List   Diagnosis Date Noted  . Premature cervical dilation in third trimester 06/04/2013  . Short cervix in second trimester, antepartum 05/06/2013  . History of preterm delivery, currently pregnant in third trimester 04/27/2012    PLAN: receheck translabial cervix dilation today.  Onofre Gains V 06/12/2013,7:58 AM

## 2013-06-13 NOTE — Progress Notes (Signed)
Patient ID: Tonya Burns, female   DOB: [birth year], 23 y.o.   MRN: [id number] FACULTY PRACTICE ANTEPARTUM(COMPREHENSIVE) NOTE  Tonya Burns is a 23 y.o. G2P0100 at 2911w4d  who is admitted for Preterm labor and advanced cervical dilation.   Length of Stay:  9  Days  Subjective: Patient reports the fetal movement as active. Patient reports uterine contraction  activity as none. Patient reports  vaginal bleeding as none. Patient describes fluid per vagina as None.  Vitals:  Blood pressure 106/53, pulse 92, temperature 99 F (37.2 C), temperature source Oral, resp. rate 20, height 5\' 2"  (1.575 m), weight 167 lb 3.2 oz (75.841 kg), last menstrual period 09/20/2012. Physical Examination:  General appearance - alert, well appearing, and in no distress Abdomen - soft, nontender, nondistended, no masses or organomegaly Extremities - no edema, redness or tenderness in the calves or thighs  Labs:  No results found for this or any previous visit (from the past 24 hour(s)).  Imaging Studies:    No measurable cervix.  Funneling limited to external os (not protruding into vagina) per US on 06/12/12   Medications:  Scheduled . acyclovir  400 mg Oral TID  . docusate sodium  100 mg Oral Daily  . hydroxyprogesterone caproate  250 mg Intramuscular Weekly  . NIFEdipine  30 mg Oral BID  . prenatal multivitamin  1 tablet Oral Q1200     ASSESSMENT: Patient Active Problem List   Diagnosis Date Noted  . Premature cervical dilation in third trimester 06/04/2013  . Short cervix in second trimester, antepartum 05/06/2013  . History of preterm delivery, currently pregnant in third trimester 04/27/2012    PLAN: Continue expectant management. Consider d/c at 34 weeks if labor does not occur.  Shon Mansouri H. 06/13/2013,8:05 AM

## 2013-06-14 NOTE — Progress Notes (Signed)
Patient ID: Tonya LaosJazmine E Kennerson, female   DOB: September 12, 1990, 23 y.o.   MRN: [id number] ACULTY PRACTICE ANTEPARTUM COMPREHENSIVE PROGRESS NOTE  Tonya Burns is a 23 y.o. G2P0100 at 2679w5d  who is admitted for Preterm labor, premature cervical dilvation.   Fetal presentation is cephalic. Length of Stay:  10  Days  Subjective: Pt c/o mild pain in mid pelvic.  Not intermittent.   Patient reports good fetal movement.  She reports no uterine contractions, no bleeding and no loss of fluid per vagina.  Vitals:  Blood pressure 106/57, pulse 96, temperature 98.5 F (36.9 C), temperature source Oral, resp. rate 18, height 5\' 2"  (1.575 m), weight 167 lb 3.2 oz (75.841 kg), last menstrual period 09/20/2012. Physical Examination: General appearance - alert, well appearing, and in no distress Abdomen - soft, nontender, nondistended, no masses or organomegaly gravid Extremities - peripheral pulses normal, no pedal edema, no clubbing or cyanosis Cervical Exam: Not evaluated Membranes:intact  Fetal Monitoring:  Baseline: 130's bpm, Variability: Good {> 6 bpm), Accelerations: Reactive and Decelerations: Absent  Labs:  No results found for this or any previous visit (from the past 24 hour(s)).  Imaging Studies:    06/12/2013 U-shaped funneling to the os.   Visibly dilated.  AFI 16.8  Medications:  Scheduled . acyclovir  400 mg Oral TID  . docusate sodium  100 mg Oral Daily  . hydroxyprogesterone caproate  250 mg Intramuscular Weekly  . NIFEdipine  30 mg Oral BID  . prenatal multivitamin  1 tablet Oral Q1200   I have reviewed the patient's current medications.  ASSESSMENT: Patient Active Problem List   Diagnosis Date Noted  . Premature cervical dilation in third trimester 06/04/2013  . Short cervix in second trimester, antepartum 05/06/2013  . History of preterm delivery, currently pregnant in third trimester 04/27/2012    PLAN: No changes in care recommended at this time.  Deliver for maternal or  fetal indications.  Continue routine antenatal care.   HARRAWAY-SMITH, Julis Haubner 06/14/2013,9:00 AM

## 2013-06-15 NOTE — Progress Notes (Signed)
Patient ID: Tonya Burns, female   DOB: 1991/03/30, 23 y.o.   MRN: [id number] Patient ID: Tonya Burns, female   DOB: 1991/03/30, 23 y.o.   MRN: [id number] ACULTY PRACTICE ANTEPARTUM COMPREHENSIVE PROGRESS NOTE  Tonya Burns is a 23 y.o. G2P0100 at 3248w6d Estimated Date of Delivery: 08/04/13   who is admitted for Preterm labor, premature cervical dilvation.   Fetal presentation is cephalic. Length of Stay:  11  Days  Subjective:  Patient reports good fetal movement.  She reports no uterine contractions, no bleeding and no loss of fluid per vagina.  Vitals:  Blood pressure 88/41, pulse 76, temperature 98.2 F (36.8 C), temperature source Oral, resp. rate 18, height 5\' 2"  (1.575 m), weight 167 lb 3.2 oz (75.841 kg), last menstrual period 09/20/2012. Physical Examination: General appearance - alert, well appearing, and in no distress Abdomen - soft, nontender, nondistended, no masses or organomegaly gravid Extremities - peripheral pulses normal, no pedal edema, no clubbing or cyanosis Cervical Exam: Not evaluated Membranes:intact  Fetal Monitoring:  Baseline: 130's bpm, Variability: Good {> 6 bpm), Accelerations: Reactive and Decelerations: Absent  Labs:  No results found for this or any previous visit (from the past 24 hour(s)).  Imaging Studies:    06/12/2013 U-shaped funneling to the os.   Visibly dilated.  AFI 16.8  Medications:  Scheduled . acyclovir  400 mg Oral TID  . docusate sodium  100 mg Oral Daily  . hydroxyprogesterone caproate  250 mg Intramuscular Weekly  . NIFEdipine  30 mg Oral BID  . prenatal multivitamin  1 tablet Oral Q1200   I have reviewed the patient's current medications.  ASSESSMENT: 4748w6d Estimated Date of Delivery: 08/04/13  Patient Active Problem List   Diagnosis Date Noted  . Premature cervical dilation in third trimester 06/04/2013  . Short cervix in second trimester, antepartum 05/06/2013  . History of preterm delivery, currently pregnant in third  trimester 04/27/2012    PLAN: No changes in care recommended at this time.  Deliver for maternal or fetal indications.  Continue routine antenatal care.   EURE,LUTHER H 06/15/2013,9:03 AM

## 2013-06-16 LAB — CBC
HEMATOCRIT: 30.2 % — AB (ref 36.0–46.0)
Hemoglobin: 10.3 g/dL — ABNORMAL LOW (ref 12.0–15.0)
MCH: 29.8 pg (ref 26.0–34.0)
MCHC: 34.1 g/dL (ref 30.0–36.0)
MCV: 87.3 fL (ref 78.0–100.0)
Platelets: 173 10*3/uL (ref 150–400)
RBC: 3.46 MIL/uL — ABNORMAL LOW (ref 3.87–5.11)
RDW: 13.9 % (ref 11.5–15.5)
WBC: 12 10*3/uL — AB (ref 4.0–10.5)

## 2013-06-16 NOTE — Progress Notes (Signed)
Ms Tonya Burns reported that she was doing fine and that she has good family support.  She did not wish to talk at this time, but is aware of on-going availability of chaplain support.  Centex CorporationChaplain Katy Joda Braatz Pager, [phone] 4:18 PM   06/16/13 1600  Clinical Encounter Type  Visited With Patient  Visit Type Spiritual support  Referral From Nurse (Also present, Estell HarpinHillary Irusta)  Stress Factors  Patient Stress Factors (Hx of loss at 22 weeks)

## 2013-06-16 NOTE — Progress Notes (Signed)
Patient ID: Tonya Burns, female   DOB: September 21, 1990, 23 y.o.   MRN: [id number] Patient ID: Tonya Burns, female   DOB: September 21, 1990, 23 y.o.   MRN: [id number] Patient ID: Tonya Burns, female   DOB: September 21, 1990, 23 y.o.   MRN: [id number] ACULTY PRACTICE ANTEPARTUM COMPREHENSIVE PROGRESS NOTE  Tonya Burns is a 23 y.o. G2P0100 at 4264w0d Estimated Date of Delivery: 08/04/13   who is admitted for Preterm labor, premature cervical dilvation.   Fetal presentation is cephalic. Length of Stay:  12  Days  Subjective:  Patient reports good fetal movement.  She reports no uterine contractions, no bleeding and no loss of fluid per vagina.  Vitals:  Blood pressure 107/55, pulse 102, temperature 98.5 F (36.9 C), temperature source Oral, resp. rate 20, height 5\' 2"  (1.575 m), weight 167 lb 3.2 oz (75.841 kg), last menstrual period 09/20/2012. Physical Examination: General appearance - alert, well appearing, and in no distress Abdomen - soft, nontender, nondistended, no masses or organomegaly gravid Extremities - peripheral pulses normal, no pedal edema, no clubbing or cyanosis Cervical Exam: Not evaluated Membranes:intact  Fetal Monitoring:  Baseline: 130's bpm, Variability: Good {> 6 bpm), Accelerations: Reactive and Decelerations: Absent  Labs:  Results for orders placed during the hospital encounter of 06/04/13 (from the past 24 hour(s))  CBC   Collection Time    06/16/13  6:00 AM      Result Value Ref Range   WBC 12.0 (*) 4.0 - 10.5 K/uL   RBC 3.46 (*) 3.87 - 5.11 MIL/uL   Hemoglobin 10.3 (*) 12.0 - 15.0 g/dL   HCT 69.630.2 (*) 29.536.0 - 28.446.0 %   MCV 87.3  78.0 - 100.0 fL   MCH 29.8  26.0 - 34.0 pg   MCHC 34.1  30.0 - 36.0 g/dL   RDW 13.213.9  44.011.5 - 10.215.5 %   Platelets 173  150 - 400 K/uL    Imaging Studies:    06/12/2013 U-shaped funneling to the os.   Visibly dilated.  AFI 16.8  Medications:  Scheduled . acyclovir  400 mg Oral TID  . docusate sodium  100 mg Oral Daily  . hydroxyprogesterone  caproate  250 mg Intramuscular Weekly  . NIFEdipine  30 mg Oral BID  . prenatal multivitamin  1 tablet Oral Q1200   I have reviewed the patient's current medications.  ASSESSMENT: 6163w6d Estimated Date of Delivery: 08/04/13  Patient Active Problem List   Diagnosis Date Noted  . Premature cervical dilation in third trimester 06/04/2013  . Short cervix in second trimester, antepartum 05/06/2013  . History of preterm delivery, currently pregnant in third trimester 04/27/2012    PLAN: S/P betamethasone course twice No changes in care recommended at this time.  Deliver for maternal or fetal indications.  Continue routine antenatal care.   Tonya Burns H 06/16/2013,7:30 AM

## 2013-06-17 NOTE — Progress Notes (Signed)
UR completed 

## 2013-06-17 NOTE — Progress Notes (Signed)
Patient ID: Bennetta LaosJazmine E Brands, female   DOB: Apr 23, 1990, 23 y.o.   MRN: [id number] Bennetta LaosJazmine E Birkel is a 23 y.o. G2P0100 at 3976w1d Estimated Date of Delivery: 08/04/13  who is admitted for Preterm labor, premature cervical dilvation.  Fetal presentation is cephalic.  Length of Stay: 13 Days  Subjective:  Patient reports good fetal movement. She reports no uterine contractions, no bleeding and no loss of fluid per vagina.  Vitals: Blood pressure 107/55, pulse 102, temperature 98.5 F (36.9 C), temperature source Oral, resp. rate 20, height 5\' 2"  (1.575 m), weight 167 lb 3.2 oz (75.841 kg), last menstrual period 09/20/2012.  Physical Examination:  General appearance - alert, well appearing, and in no distress  Abdomen - soft, nontender, nondistended, no masses or organomegaly  gravid  Extremities - peripheral pulses normal, no pedal edema, no clubbing or cyanosis  Cervical Exam: Not evaluated Membranes:intact  Fetal Monitoring: Baseline: 130's bpm, Variability: Good {> 6 bpm), Accelerations: Reactive and Decelerations: Absent  Labs:  Results for orders placed during the hospital encounter of 06/04/13 (from the past 24 hour(s))   CBC    Collection Time    06/16/13 6:00 AM   Result  Value  Ref Range    WBC  12.0 (*)  4.0 - 10.5 K/uL    RBC  3.46 (*)  3.87 - 5.11 MIL/uL    Hemoglobin  10.3 (*)  12.0 - 15.0 g/dL    HCT  40.930.2 (*)  81.136.0 - 46.0 %    MCV  87.3  78.0 - 100.0 fL    MCH  29.8  26.0 - 34.0 pg    MCHC  34.1  30.0 - 36.0 g/dL    RDW  91.413.9  78.211.5 - 95.615.5 %    Platelets  173  150 - 400 K/uL   Imaging Studies:  06/12/2013 U-shaped funneling to the os. Visibly dilated. AFI 16.8  Medications: Scheduled  .  acyclovir  400 mg  Oral  TID   .  docusate sodium  100 mg  Oral  Daily   .  hydroxyprogesterone caproate  250 mg  Intramuscular  Weekly   .  NIFEdipine  30 mg  Oral  BID   .  prenatal multivitamin  1 tablet  Oral  Q1200   I have reviewed the patient's current medications.  ASSESSMENT:   8852w6d Estimated Date of Delivery: 08/04/13  Patient Active Problem List    Diagnosis  Date Noted   .  Premature cervical dilation in third trimester  06/04/2013   .  Short cervix in second trimester, antepartum  05/06/2013   .  History of preterm delivery, currently pregnant in third trimester  04/27/2012   PLAN:  S/P betamethasone course twice  No changes in care recommended at this time. Deliver for maternal or fetal indications.  Continue routine antenatal care.

## 2013-06-18 NOTE — Progress Notes (Signed)
Patient ID: Tonya Burns, female   DOB: January 03, 1991, 23 y.o.   MRN: [id number] ACULTY PRACTICE ANTEPARTUM COMPREHENSIVE PROGRESS NOTE  Tonya Burns is a 23 y.o. G2P0100 at 352w2d  who is admitted for Preterm labor, advanced cervical dilation.   Fetal presentation is cephalic. Length of Stay:  14  Days  Subjective: Pt denies further contractions. Patient reports good fetal movement.  She reports no uterine contractions, no bleeding and no loss of fluid per vagina.  Vitals:  Blood pressure 98/55, pulse 89, temperature 99 F (37.2 C), temperature source Oral, resp. rate 20, height 5\' 2"  (1.575 m), weight 167 lb 3.2 oz (75.841 kg), last menstrual period 09/20/2012. Physical Examination: General appearance - alert, well appearing, and in no distress Abdomen - soft, nontender, nondistended, no masses or organomegaly gravid Extremities - no pedal edema noted Cervical Exam: Not evaluated.  Membranes:intact  Labs:  No results found for this or any previous visit (from the past 24 hour(s)).  Imaging Studies:    noen new  Medications:  Scheduled . acyclovir  400 mg Oral TID  . docusate sodium  100 mg Oral Daily  . hydroxyprogesterone caproate  250 mg Intramuscular Weekly  . NIFEdipine  30 mg Oral BID  . prenatal multivitamin  1 tablet Oral Q1200   I have reviewed the patient's current medications.  ASSESSMENT: Patient Active Problem List   Diagnosis Date Noted  . Premature cervical dilation in third trimester 06/04/2013  . Short cervix in second trimester, antepartum 05/06/2013  . History of preterm delivery, currently pregnant in third trimester 04/27/2012    PLAN:  Continue routine antenatal care.   HARRAWAY-SMITH, Rebecah Dangerfield 06/18/2013,8:14 AM

## 2013-06-19 DIAGNOSIS — O09219 Supervision of pregnancy with history of pre-term labor, unspecified trimester: Secondary | ICD-10-CM

## 2013-06-19 DIAGNOSIS — O26879 Cervical shortening, unspecified trimester: Secondary | ICD-10-CM

## 2013-06-19 NOTE — Progress Notes (Signed)
Patient ID: Tonya Burns, female   DOB: 11/17/1990, 23 y.o.   MRN: [id number] ACULTY PRACTICE ANTEPARTUM COMPREHENSIVE PROGRESS NOTE  Tonya Burns is a 23 y.o. G2P0100 at 7383w3d  who is admitted for preterm labor, advanced cervical dilation.   Fetal presentation is cephalic.  Length of Stay:  15  Days  Subjective: Pt denies further contractions. Patient reports good fetal movement.  She reports no uterine contractions, no bleeding and no loss of fluid per vagina.  Vitals:  Blood pressure 89/46, pulse 89, temperature 98.2 F (36.8 C), temperature source Oral, resp. rate 18, height 5\' 2"  (1.575 m), weight 163 lb 8 oz (74.163 kg), last menstrual period 09/20/2012. Physical Examination: General appearance - alert, well appearing, and in no distress Abdomen - soft, gravid, nontender, nondistended Cervical Exam: Not evaluated.  Membranes:intact  FHR: 150 bpm, moderate variability, +accels, no decels  Toco: rare contractions  Labs:  No results found for this or any previous visit (from the past 24 hour(s)).  Imaging Studies:   None new    Medications:  Scheduled . acyclovir  400 mg Oral TID  . docusate sodium  100 mg Oral Daily  . hydroxyprogesterone caproate  250 mg Intramuscular Weekly  . NIFEdipine  30 mg Oral BID  . prenatal multivitamin  1 tablet Oral Q1200   I have reviewed the patient's current medications.  ASSESSMENT: Patient Active Problem List   Diagnosis Date Noted  . Premature cervical dilation in third trimester 06/04/2013    Priority: High  . History of preterm delivery, currently pregnant in third trimester 04/27/2012    Priority: Medium  . Short cervix in second trimester, antepartum 05/06/2013    PLAN: Continue routine antenatal care.   Tereso NewcomerANYANWU,Lukas Pelcher A, M.D. 06/19/2013,10:45 AM

## 2013-06-20 NOTE — Progress Notes (Signed)
Patient ID: Tonya Burns, female   DOB: [birth year], 23 y.o.   MRN: [id number] ACULTY PRACTICE ANTEPARTUM COMPREHENSIVE PROGRESS NOTE  Tonya Burns is a 23 y.o. G2P0100 at 1193w4d  who is admitted for preterm labor, advanced cervical dilation.   Fetal presentation is cephalic.  Length of Stay:  16  Days  Subjective: Pt denies further contractions. Patient reports good fetal movement.  She reports no uterine contractions, no bleeding and no loss of fluid per vagina.  Vitals:  Blood pressure 105/54, pulse 97, temperature 99 F (37.2 C), temperature source Oral, resp. rate 18, height 5\' 2"  (1.575 m), weight 163 lb 8 oz (74.163 kg), last menstrual period 09/20/2012. Physical Examination: General appearance - alert, well appearing, and in no distress Abdomen - soft, gravid, nontender, nondistended Cervical Exam: Not evaluated.  Membranes:intact  FHR: 150 bpm, moderate variability, +accels, no decels  Toco: rare contractions  Labs:  No results found for this or any previous visit (from the past 24 hour(s)).  Imaging Studies:   None new    Medications:  Scheduled . acyclovir  400 mg Oral TID  . docusate sodium  100 mg Oral Daily  . hydroxyprogesterone caproate  250 mg Intramuscular Weekly  . NIFEdipine  30 mg Oral BID  . prenatal multivitamin  1 tablet Oral Q1200   I have reviewed the patient's current medications.  ASSESSMENT: Patient Active Problem List   Diagnosis Date Noted  . Premature cervical dilation in third trimester 06/04/2013    Priority: High  . History of preterm delivery, currently pregnant in third trimester 04/27/2012    Priority: Medium  . Short cervix in second trimester, antepartum 05/06/2013    PLAN: Continue routine antenatal care.   Tereso NewcomerANYANWU,Chee Dimon A, M.D. 06/20/2013,7:58 AM

## 2013-06-20 NOTE — H&P (Signed)
Tilda BurrowJohn V Garlin Batdorf, MD Physician Signed  H&P Service date: 06/04/2013 12:43 PM  Related encounter: [facility]   Merry LoftyJazmine E Wallace CullensGray is a 23 y.o. female G1 55P0100 presenting for admission at 5125w2d for preterm cervical dilation to 3.5 cm, without SROM or labor. Pt has been having a clear mucoid d/c x 3 days.NO gush of fluid or bleeding or Abdominal pain. Sterile speculum exam shows visible membranes at the external os to 3-4 cm, measured by trans Labial u/s as 3.6 cm dilation.  Pt denies sexual activity in several weeks, is on weekly 17-P and nightly prometrium 200 mg PV, last dose last night.  Pt cervix was 3.0 cm cervix length at 19 wks, gradually thinned out and funnelled, was 1.4 cm length 4 wk ago, and 2.7 cm closed and 2.7 cm funnelling at 2 wk ago u/s.  History    OB History     Grav  Para  Term  Preterm  Abortions  TAB  SAB  Ect  Mult  Living     2  1   1      1   0       OB HX : prior premature delivery at 22 wks after [facility], exam reported as normal, but [facility] with bulging BOW and delivered a previable infant within the ED visit.    Past Medical History    Diagnosis  Date    .  Pregnant     .  Pregnant  12/30/2012     No past surgical history on file.  Family History: family history includes Hypertension in her father.  Social History: reports that she has quit smoking. Her smoking use included Cigarettes. She smoked 0.00 packs per day. She has never used smokeless tobacco. She reports that she does not drink alcohol or use illicit drugs.     Prenatal Transfer Tool     Maternal Diabetes: No  Genetic Screening: Normal  Maternal Ultrasounds/Referrals: Normal, progressive shortening of cervix, prometrium added  Fetal Ultrasounds or other Referrals: None  Maternal Substance Abuse: No Confirmatory uds ordered at admit  Significant Maternal Medications: Meds include: Other: 17-P 250 mg im  weekly, prometrium  Significant Maternal Lab Results: Lab values include: Other: GBS collected at office 2/25  Other Comments:  ROS  Denies contractions or bleeding or ROM.   Last menstrual period 09/20/2012.  Exam  Physical Exam  Constitutional: She appears well-developed and well-nourished.  HENT:  Head: Normocephalic.  Eyes: Pupils are equal, round, and reactive to light.  Neck: Neck supple.  Cardiovascular: Normal rate and regular rhythm.  Respiratory: Effort normal.  GI: Soft.  Gravid nontender uterus c/w dates U/s done to confirm Vertex, and trans Labial u/s shows 3.6 cm BOW bulging thru cervix to even with ext margin of cervix   Prenatal labs:  ABO, Rh: A/POS/-- (09/22 1255)  Antibody: NEG (01/27 0827)  Rubella: 1.95 (09/22 1255)  RPR: NON REAC (01/27 0827)  HBsAg: NEGATIVE (09/22 1255)  HIV: NON REACTIVE (01/27 0827)  GBS: collected 2/.25 in office  Assessment/Plan:  Admit to Antenatal  Ext monitoring to R/O PTL  BMZ 12 mg q 24 hr ordered  Needs order for Mag Sulfate for neuroprophylaxis  Remainder of admit orders ,including BMZ placed.  Discussed with Dr Erin FullingHarraway Smith.

## 2013-06-21 NOTE — Progress Notes (Signed)
Patient ID: Tonya Burns, female   DOB: [birth year], 23 y.o.   MRN: [id number] FACULTY PRACTICE ANTEPARTUM(COMPREHENSIVE) NOTE  Tonya Burns is a 23 y.o. G2P0100 at 7960w5d by best clinical estimate who is admitted for Preterm labor.   Fetal presentation is cephalic. Length of Stay:  17  Days  Subjective: No contractions Patient reports the fetal movement as active. Patient reports uterine contraction  activity as none. Patient reports  vaginal bleeding as none. Patient describes fluid per vagina as None.  Vitals:  Blood pressure 94/38, pulse 73, temperature 98.5 F (36.9 C), temperature source Oral, resp. rate 18, height 5\' 2"  (1.575 m), weight 163 lb 8 oz (74.163 kg), last menstrual period 09/20/2012. Physical Examination:  General appearance - alert, well appearing, and in no distress Abdomen - gravid, nt Fundal Height:  size equals dates Extremities: extremities normal, atraumatic, no cyanosis or edema  Membranes:intact  Fetal Monitoring:  Baseline: 125 bpm, Variability: Good {> 6 bpm), Accelerations: Reactive and Decelerations: Absent  Labs:  No results found for this or any previous visit (from the past 24 hour(s)).    Medications:  Scheduled . acyclovir  400 mg Oral TID  . docusate sodium  100 mg Oral Daily  . hydroxyprogesterone caproate  250 mg Intramuscular Weekly  . NIFEdipine  30 mg Oral BID  . prenatal multivitamin  1 tablet Oral Q1200   I have reviewed the patient's current medications.  ASSESSMENT: Patient Active Problem List   Diagnosis Date Noted  . Premature cervical dilation in third trimester 06/04/2013  . Short cervix in second trimester, antepartum 05/06/2013  . History of preterm delivery, currently pregnant in third trimester 04/27/2012    PLAN: Doing well Plan for d/c in 2 days if remains stable.  Tonya Burns S 06/21/2013,7:53 AM

## 2013-06-22 NOTE — Progress Notes (Signed)
Reactive NST 

## 2013-06-22 NOTE — Progress Notes (Signed)
Patient ID: Tonya Burns, female   DOB: [birth year], 23 y.o.   MRN: [id number] FACULTY PRACTICE ANTEPARTUM(COMPREHENSIVE) NOTE  Tonya Burns is a 23 y.o. G2P0100 at 2864w6d  who is admitted for advanced cervical dilation.   Length of Stay:  18  Days  Subjective: No complaints Patient reports the fetal movement as active. Patient reports uterine contraction  activity as none. Patient reports  vaginal bleeding as none. Patient describes fluid per vagina as None.  Vitals:  Blood pressure 81/37, pulse 79, temperature 98.5 F (36.9 C), temperature source Oral, resp. rate 18, height 5\' 2"  (1.575 m), weight 163 lb 8 oz (74.163 kg), last menstrual period 09/20/2012. Physical Examination:  General appearance - alert, well appearing, and in no distress Abdomen - gravid, soft, non tender Extremities - no edema, redness or tenderness in the calves or thighs  Fetal Monitoring:  Baseline: 145 bpm, Variability: Good {> 6 bpm), Accelerations: Reactive and Decelerations: Absent  Labs:  None  Medications:  Scheduled . acyclovir  400 mg Oral TID  . docusate sodium  100 mg Oral Daily  . hydroxyprogesterone caproate  250 mg Intramuscular Weekly  . NIFEdipine  30 mg Oral BID  . prenatal multivitamin  1 tablet Oral Q1200   I have reviewed the patient's current medications.  ASSESSMENT: Patient Active Problem List   Diagnosis Date Noted  . Premature cervical dilation in third trimester 06/04/2013  . Short cervix in second trimester, antepartum 05/06/2013  . History of preterm delivery, currently pregnant in third trimester 04/27/2012    PLAN: Discharge at 34 weeks if no problems Contoinue NST bid  Arthur Speagle H. 06/22/2013,7:28 AM

## 2013-06-23 LAB — CBC
HEMATOCRIT: 33.6 % — AB (ref 36.0–46.0)
HEMOGLOBIN: 11.5 g/dL — AB (ref 12.0–15.0)
MCH: 30.2 pg (ref 26.0–34.0)
MCHC: 34.2 g/dL (ref 30.0–36.0)
MCV: 88.2 fL (ref 78.0–100.0)
Platelets: 165 10*3/uL (ref 150–400)
RBC: 3.81 MIL/uL — AB (ref 3.87–5.11)
RDW: 13.8 % (ref 11.5–15.5)
WBC: 10.2 10*3/uL (ref 4.0–10.5)

## 2013-06-23 MED ORDER — NIFEDIPINE ER 30 MG PO TB24: 30.0000 mg | ORAL_TABLET | Freq: Two times a day (BID) | ORAL | Status: DC

## 2013-06-23 MED ORDER — ACYCLOVIR 400 MG PO TABS: 400.0000 mg | ORAL_TABLET | Freq: Three times a day (TID) | ORAL | Status: DC

## 2013-06-23 NOTE — Progress Notes (Signed)
Dc instructions given to pt no questions or concerns noted pt anxious to be dc from hospital.   Denies any co's at present.

## 2013-06-23 NOTE — Discharge Instructions (Signed)
Cervical Insufficiency  Cervical insufficiency is when the cervix is weak and starts to open (dilate) and thin (efface) before the pregnancy is at term and without labor starting. This is also called incompetent cervix. It can happen in the second or third trimester when the fetus starts putting pressure on the cervix. Cervical insufficiency can lead to a miscarriage, preterm premature rupture of the membranes (PPROM), or having the baby early (preterm birth).  RISK FACTORS You may be more likely to develop cervical insufficiency if:  You have a shorter cervix than normal.  Damage or injury occurred to your cervix from a past pregnancy or surgery.  You were born with a cervical defect.  You have had procedure done on the cervix, such as cervical biopsy.  You have a history of cervical insufficiency.  You have a history of PPROM.  You have ended several past pregnancies through abortion.  You were exposed to the drug diethylstilbestrol (DES). SYMPTOMS Often times, women do not have any symptoms. Other times, woman may only have mild symptoms that often start between week 14 through 20. The symptoms may last several days or weeks. These symptoms include:  Light spotting or bleeding from the vagina.  Pelvic pressure.  A change in vaginal discharge, such as discharge that changes from clear, white, or light yellow to pink or tan.  Back pain.  Abdominal pain or cramping. DIAGNOSIS Cervical insufficiency cannot be diagnosed before you become pregnant. Once you are pregnant, your caregiver will ask about your medical history and if you have had any problems in past pregnancies. Tell your caregiver about any procedures performed on your cervix or if you have a history of miscarriages or cervical insufficiency. If your caregiver thinks you are at high risk for cervical insufficiency or show signs of cervical insufficiency, he or she may:  Perform a pelvic exam. This will check for:  The  presence of the membranes (amniotic sac) coming out of the cervix.  Cervical abnormalities.  Cervical injuries.  The presence of contractions.  Perform an ultrasonography (commonly called ultrasound) to measure the length and thickness of the cervix. TREATMENT If you have been diagnosed with cervical insufficiency, your caregiver may recommend:  Limiting physical activity.  Bed rest at home or in the hospital.  Pelvic rest, which means no sexual intercourse or placing anything in the vagina.  Cerclage to sew the cervix closed and prevent it from opening too early. The stitches (sutures) are removed between weeks 36 and 38 to avoid problems during labor. Cerclage may be recommended during pregnancy if you have had a history of miscarriages or preterm births without a known cause. It may also be recommended if you have a short cervix that was identified by ultrasound or if your caregiver has found that your cervix has dilated before 24 weeks of pregnancy. Limiting physical activity and bed rest may or may not help prevent a preterm birth. WHEN SHOULD YOU SEEK IMMEDIATE MEDICAL CARE?  Seek immediate medical care if you show any symptoms of cervical insufficiency. You will need to go to the hospital to get checked immediately. Document Released: 03/27/2005 Document Revised: 11/27/2012 Document Reviewed: 06/03/2012 [facility] Patient Information 2014 [place], [place]. Preterm Birth Preterm birth is a birth that happens before 37 weeks of pregnancy. Most pregnancies last about 39 41 weeks. Every week in the womb is important and is beneficial to the health of the infant. Infants born before 37 weeks of pregnancy are at a higher risk for complications. Depending  on when the infant was born, he or she may be:  Late preterm. Born between 32 weeks and 37 weeks of pregnancy.  Very preterm. Born at less than 32 weeks of pregnancy.  Extremely preterm. Born at less than 25 weeks of pregnancy. The  earlier a baby is born, the more likely the child will have issues related to prematurity. Complications and problems that can be seen in infants born too early include:  Problems breathing (respiratory distress syndrome).  Low birth weight.  Problems feeding.  Sleeping problems.  Yellowing of the skin (jaundice).  Infections such as pneumonia. Babies born very preterm or extremely preterm are at risk for more serious medical issues. These include:  More severe breathing issues.  Eyesight issues.  Brain development issues (intraventricular hemorrhage).  Behavioral and emotional development issues.  Growth and developmental delays.  Cerebral palsy.  Serious feeding or bowel complications (necrotizing enterocolitis). CAUSES  There are two broad categories of preterm birth.  Spontaneous preterm birth. This is a birth resulting from preterm labor (not medically induced) or preterm premature rupture of membranes (PPROM).  Indicated preterm birth. This is a birth resulting from labor being medically induced due to health, personal, or social reasons. RISK FACTORS Preterm birth may be related to certain medical conditions, lifestyle factors, or demographic factors encountered by the mother or fetus.  Medical conditions include:  Multiple gestations (twins, triplets, and so on).  Infection.  Diabetes.  Heart disease.  Kidney disease.  Cervical or uterine abnormalities.  Being underweight.  High blood pressure or preeclampsia.  Premature rupture of membranes (PROM).  Birth defects in the fetus.  Lifestyle factors include:  Poor prenatal care.  Poor nutrition or anemia.  Cigarette smoking.  Consuming alcohol.  High levels of stress and lack of social or emotional support.  Exposure to chemical or environmental toxins.  Substance abuse.  Demographic factors include:  African-American ethnicity.  Age (younger than 80 or older than 22 years of  age).  Low socioeconomic status. Women with a history of preterm labor or who become pregnant within 50 months of giving birth are also at increased risk for preterm birth. DIAGNOSIS  Your health care provider may request additional tests to diagnose underlying complications resulting from preterm birth. Tests on the infant may include:  Physical exam.  Blood tests.  Chest X-rays.  Heart-lung monitoring. TREATMENT  After birth, special care will be taken to assess any problems or complications for the infant. Supportive care will be provided for the infant. Treatment depends on what problems are present and any complications that develop. Some preterm infants are cared for in a neonatal intensive care unit. In general, care may include:  Maintaining temperature and oxygen in a clear heated box (baby isolette).  Monitoring the infant's heart rate, breathing, and level of oxygen in the blood.  Monitoring for signs of infection and, if needed, giving IV antibiotic medicine.  Inserting a feeding tube (nose, mouth) or giving IV nutrition if unable to feed.  Inserting a breathing tube (ventilation).  Respiration support (continuous positive airway pressure [CPAP] or oxygen). Treatment will change as the infant builds up strength and is able to breathe and eat on his or her own. For some infants, no special treatment is necessary. Parents may be educated on the potential health risks of prematurity to the infant. HOME CARE INSTRUCTIONS  Understand your infant's special conditions and needs. It may be reassuring to learn about infant CPR.  Monitor your infant in the car  seat until he or she grows and matures. Infant car seats can cause breathing difficulties for preterm infants.  Keep your infant warm. Dress your infant in layers and keep him or her away from drafts, especially in cold months of the year.  Wash your hands thoroughly after going to the bathroom or changing a diaper. Late  preterm infants may be more prone to infection.  Follow all your health care provider's instructions for providing support and care to your preterm infant.  Get support from organizations and groups that understand your challenges.  Follow up with your infant's health care provider as directed. Prevention There are some things you can do to help lower your risk of having a preterm infant in the future. These include:  Good prenatal care throughout the entire pregnancy. See a health care provider regularly for advice and tests.  Management of underlying medical conditions.  Proper self-care and lifestyle changes.  Proper diet and weight control.  Watching for signs of various infections. SEEK MEDICAL CARE IF:  Your infant has feeding difficulties.  Your infant has sleeping difficulties.  Your infant has breathing difficulties.  Your infant's skin starts to look yellow.  Your infant shows signs of infection, such as a stuffy nose, fever, crying, or bluish color of the skin. FOR MORE INFORMATION March of Dimes: www.marchofdimes.com Prematurity.org: www.prematurity.org Document Released: 06/17/2003 Document Revised: 01/15/2013 Document Reviewed: 10/24/2012 [facility] Patient Information 2014 [place], [place].

## 2013-06-23 NOTE — Progress Notes (Signed)
Voiced no concerns and no questions regarding dc instructions.

## 2013-06-23 NOTE — Discharge Summary (Signed)
Antenatal Physician Discharge Summary  Patient ID: Tonya Burns MRN: [id number] DOB/AGE: October 26, 1990 23 y.o.  Admit date: 06/04/2013 Discharge date: 06/23/2013  Admission Diagnoses: cervical insufficiency  Discharge Diagnoses: cervical insufficiency  Prenatal Procedures: NST and ultrasound  Intrapartum Procedures: Neonatology, Maternal Fetal Medicine BMZ x 2 doese and magnesium sulfate for neuorprophylaxis  Significant Diagnostic Studies:  Results for orders placed during the hospital encounter of 06/04/13 (from the past 168 hour(s))  CBC   Collection Time    06/23/13  5:52 AM      Result Value Ref Range   WBC 10.2  4.0 - 10.5 K/uL   RBC 3.81 (*) 3.87 - 5.11 MIL/uL   Hemoglobin 11.5 (*) 12.0 - 15.0 g/dL   HCT 40.9 (*) 81.1 - 91.4 %   MCV 88.2  78.0 - 100.0 fL   MCH 30.2  26.0 - 34.0 pg   MCHC 34.2  30.0 - 36.0 g/dL   RDW 78.2  95.6 - 21.3 %   Platelets 165  150 - 400 K/uL    Treatments: IV hydration and BMZ and Magnesium sulfate  Hospital Course:  This is a 23 y.o. G2P0100 with IUP at [redacted]w[redacted]d admitted for advanced cervical dilation and funneling of amniotic membranes. She was initially started on magnesium sulfate for tocolysis and neuroprotection and also received betamethasone x 2 doses.  She was on prometrium and 17- OH- P.  She was also placed on Procardia for tocolysis. She initially had funneling of membranes into the vaginal canal which receded on subsequent sono. She was seen by Neonatology during her stay.  She was observed, fetal heart rate monitoring remained reassuring, and she had no signs/symptoms of progressing preterm labor or other maternal-fetal concerns.  Her cervical exam was improved from admission.  She was deemed stable for discharge to home with outpatient follow up.  Discharge Exam: BP 104/56  Pulse 89  Temp(Src) 98.6 F (37 C) (Oral)  Resp 20  Ht 5\' 2"  (1.575 m)  Wt 163 lb 8 oz (74.163 kg)  BMI 29.90 kg/m2  LMP 09/20/2012 General appearance:  alert and no distress GI: soft, non-tender; bowel sounds normal; no masses,  no organomegaly and gravid Pelvic: 3cm ; very soft; 70% effaced. No membranes felt in the vagina Extremities: extremities normal, atraumatic, no cyanosis or edema Incision/Wound: n/a FHR Cat 1; no contractions on toco  Discharge Condition: good  Disposition: 01-Home or Self Care  Discharge Orders   Future Orders Complete By Expires   Discharge activity:  Bathroom / Shower only  As directed    Discharge diet:  No restrictions  As directed    Do not have sex or do anything that might make you have an orgasm  As directed    Notify physician for a general feeling that "something is not right"  As directed    Notify physician for increase or change in vaginal discharge  As directed    Notify physician for intestinal cramps, with or without diarrhea, sometimes described as "gas pain"  As directed    Notify physician for leaking of fluid  As directed    Notify physician for low, dull backache, unrelieved by heat or Tylenol  As directed    Notify physician for menstrual like cramps  As directed    Notify physician for pelvic pressure  As directed    Notify physician for uterine contractions.  These may be painless and feel like the uterus is tightening or the baby is  "balling up"  As directed    Notify physician for vaginal bleeding  As directed    PRETERM LABOR:  Includes any of the follwing symptoms that occur between 20 - [redacted] weeks gestation.  If these symptoms are not stopped, preterm labor can result in preterm delivery, placing your baby at risk  As directed    Sexual Activity:    As directed    Scheduling Instructions:     Nothing per vagina.  No intercourse/sex       Medication List         acyclovir 400 MG tablet  Commonly known as:  ZOVIRAX  Take 1 tablet (400 mg total) by mouth 3 (three) times daily.     HYDROXYPROGESTERONE CAPROATE IM  Inject into the muscle once a week.     NIFEdipine 30 MG 24  hr tablet  Commonly known as:  PROCARDIA-XL/ADALAT CC  Take 1 tablet (30 mg total) by mouth 2 (two) times daily.     prenatal multivitamin Tabs tablet  Take 1 tablet by mouth daily at 12 noon.     progesterone 200 MG capsule  Commonly known as:  PROMETRIUM  Place 1 tablet in the vagina nightly at bedtime           Follow-up Information   Follow up with [facility] In 1 week.   Contact information:   [address] [place] [place] [address] [phone]      Signed: Willodean RosenthalHARRAWAY-SMITH, Tonya Burns M.D. 06/23/2013, 8:21 AM

## 2013-06-23 NOTE — Progress Notes (Signed)
Post discharge ur review completed. 

## 2013-06-25 ENCOUNTER — Encounter: Payer: Self-pay | Admitting: Advanced Practice Midwife

## 2013-06-25 ENCOUNTER — Ambulatory Visit (INDEPENDENT_AMBULATORY_CARE_PROVIDER_SITE_OTHER): Payer: BC Managed Care – PPO | Admitting: Advanced Practice Midwife

## 2013-06-25 VITALS — BP 114/60 | Wt 168.0 lb

## 2013-06-25 DIAGNOSIS — Z331 Pregnant state, incidental: Secondary | ICD-10-CM

## 2013-06-25 DIAGNOSIS — O09893 Supervision of other high risk pregnancies, third trimester: Secondary | ICD-10-CM

## 2013-06-25 DIAGNOSIS — O3433 Maternal care for cervical incompetence, third trimester: Secondary | ICD-10-CM

## 2013-06-25 DIAGNOSIS — O09213 Supervision of pregnancy with history of pre-term labor, third trimester: Secondary | ICD-10-CM

## 2013-06-25 DIAGNOSIS — O09219 Supervision of pregnancy with history of pre-term labor, unspecified trimester: Secondary | ICD-10-CM

## 2013-06-25 DIAGNOSIS — Z1389 Encounter for screening for other disorder: Secondary | ICD-10-CM

## 2013-06-25 LAB — POCT URINALYSIS DIPSTICK
Blood, UA: 1
Glucose, UA: NEGATIVE
KETONES UA: NEGATIVE
Nitrite, UA: NEGATIVE
PROTEIN UA: NEGATIVE

## 2013-06-25 MED ORDER — HYDROXYPROGESTERONE CAPROATE 250 MG/ML IM OIL
250.0000 mg | TOPICAL_OIL | Freq: Once | INTRAMUSCULAR | Status: AC
  Administered 2013-06-25: 250 mg via INTRAMUSCULAR

## 2013-06-25 NOTE — Progress Notes (Signed)
Patient ID: Tonya Burns, female   DOB: 1990/10/25, 23 y.o.   MRN: [id number] Hydroxyprogesterone caproate 250 mg IM given right deltoid with no complications. Pt has no complaints at this time.

## 2013-07-02 ENCOUNTER — Encounter: Payer: Self-pay | Admitting: Advanced Practice Midwife

## 2013-07-02 ENCOUNTER — Ambulatory Visit (INDEPENDENT_AMBULATORY_CARE_PROVIDER_SITE_OTHER): Payer: BC Managed Care – PPO | Admitting: Advanced Practice Midwife

## 2013-07-02 VITALS — BP 100/60 | Wt 169.0 lb

## 2013-07-02 DIAGNOSIS — Z331 Pregnant state, incidental: Secondary | ICD-10-CM

## 2013-07-02 DIAGNOSIS — Z3490 Encounter for supervision of normal pregnancy, unspecified, unspecified trimester: Secondary | ICD-10-CM

## 2013-07-02 DIAGNOSIS — Z1389 Encounter for screening for other disorder: Secondary | ICD-10-CM

## 2013-07-02 DIAGNOSIS — O09899 Supervision of other high risk pregnancies, unspecified trimester: Secondary | ICD-10-CM

## 2013-07-02 DIAGNOSIS — O09219 Supervision of pregnancy with history of pre-term labor, unspecified trimester: Secondary | ICD-10-CM

## 2013-07-02 DIAGNOSIS — R768 Other specified abnormal immunological findings in serum: Secondary | ICD-10-CM | POA: Insufficient documentation

## 2013-07-02 DIAGNOSIS — O3430 Maternal care for cervical incompetence, unspecified trimester: Secondary | ICD-10-CM

## 2013-07-02 LAB — POCT URINALYSIS DIPSTICK
GLUCOSE UA: NEGATIVE
Ketones, UA: NEGATIVE
NITRITE UA: NEGATIVE
Protein, UA: NEGATIVE
RBC UA: NEGATIVE

## 2013-07-02 NOTE — Addendum Note (Signed)
Addended by: Colen DarlingYOUNG, Varnika Butz S on: 07/02/2013 04:53 PM   Modules accepted: Orders

## 2013-07-02 NOTE — Progress Notes (Signed)
No c/o at this time.17p in Left deltoid.  May stop procardia and prometrium at 36 weeks.   Routine questions about pregnancy answered.  F/U in 1 weeks for GBS/Low-risk ob appt .

## 2013-07-09 ENCOUNTER — Inpatient Hospital Stay (HOSPITAL_COMMUNITY)
Admission: AD | Admit: 2013-07-09 | Discharge: 2013-07-10 | DRG: 775 | Disposition: A | Discharge status: Home / Self Care | Payer: BC Managed Care – PPO | Source: Ambulatory Visit | Attending: Obstetrics & Gynecology | Admitting: Obstetrics & Gynecology

## 2013-07-09 ENCOUNTER — Encounter: Payer: BC Managed Care – PPO | Admitting: Obstetrics & Gynecology

## 2013-07-09 ENCOUNTER — Encounter (HOSPITAL_COMMUNITY): Payer: Self-pay | Admitting: *Deleted

## 2013-07-09 DIAGNOSIS — Z87891 Personal history of nicotine dependence: Secondary | ICD-10-CM

## 2013-07-09 DIAGNOSIS — R768 Other specified abnormal immunological findings in serum: Secondary | ICD-10-CM

## 2013-07-09 DIAGNOSIS — O3433 Maternal care for cervical incompetence, third trimester: Secondary | ICD-10-CM

## 2013-07-09 DIAGNOSIS — O09893 Supervision of other high risk pregnancies, third trimester: Secondary | ICD-10-CM

## 2013-07-09 DIAGNOSIS — O09213 Supervision of pregnancy with history of pre-term labor, third trimester: Secondary | ICD-10-CM

## 2013-07-09 DIAGNOSIS — Z3490 Encounter for supervision of normal pregnancy, unspecified, unspecified trimester: Secondary | ICD-10-CM

## 2013-07-09 LAB — CBC
HEMATOCRIT: 34.5 % — AB (ref 36.0–46.0)
HEMOGLOBIN: 11.8 g/dL — AB (ref 12.0–15.0)
MCH: 30.2 pg (ref 26.0–34.0)
MCHC: 34.2 g/dL (ref 30.0–36.0)
MCV: 88.2 fL (ref 78.0–100.0)
Platelets: 155 10*3/uL (ref 150–400)
RBC: 3.91 MIL/uL (ref 3.87–5.11)
RDW: 14.1 % (ref 11.5–15.5)
WBC: 11.2 10*3/uL — ABNORMAL HIGH (ref 4.0–10.5)

## 2013-07-09 LAB — POCT FERN TEST: POCT Fern Test: POSITIVE

## 2013-07-09 LAB — RPR: RPR Ser Ql: NONREACTIVE

## 2013-07-09 MED ORDER — FENTANYL CITRATE 0.05 MG/ML IJ SOLN: 50.0000 ug | Freq: Once | INTRAMUSCULAR | Status: DC

## 2013-07-09 MED ORDER — SIMETHICONE 80 MG PO CHEW: 80.0000 mg | CHEWABLE_TABLET | ORAL | Status: DC | PRN

## 2013-07-09 MED ORDER — LANOLIN HYDROUS EX OINT: TOPICAL_OINTMENT | CUTANEOUS | Status: DC | PRN

## 2013-07-09 MED ORDER — IBUPROFEN 600 MG PO TABS: 600.0000 mg | ORAL_TABLET | Freq: Four times a day (QID) | ORAL | Status: DC | PRN

## 2013-07-09 MED ORDER — DIBUCAINE 1 % RE OINT: 1.0000 "application " | TOPICAL_OINTMENT | RECTAL | Status: DC | PRN

## 2013-07-09 MED ORDER — EPHEDRINE 5 MG/ML INJ
10.0000 mg | INTRAVENOUS | Status: DC | PRN
Start: 2013-07-09 — End: 2013-07-09
  Filled 2013-07-09: qty 2

## 2013-07-09 MED ORDER — ONDANSETRON HCL 4 MG/2ML IJ SOLN: 4.0000 mg | Freq: Four times a day (QID) | INTRAMUSCULAR | Status: DC | PRN

## 2013-07-09 MED ORDER — OXYCODONE-ACETAMINOPHEN 5-325 MG PO TABS
1.0000 | ORAL_TABLET | ORAL | Status: DC | PRN
  Administered 2013-07-09: 0.5 via ORAL
  Filled 2013-07-09: qty 1

## 2013-07-09 MED ORDER — PHENYLEPHRINE 40 MCG/ML (10ML) SYRINGE FOR IV PUSH (FOR BLOOD PRESSURE SUPPORT)
80.0000 ug | PREFILLED_SYRINGE | INTRAVENOUS | Status: DC | PRN
  Filled 2013-07-09: qty 2

## 2013-07-09 MED ORDER — OXYTOCIN BOLUS FROM INFUSION: 500.0000 mL | INTRAVENOUS | Status: DC

## 2013-07-09 MED ORDER — ONDANSETRON HCL 4 MG PO TABS: 4.0000 mg | ORAL_TABLET | ORAL | Status: DC | PRN

## 2013-07-09 MED ORDER — LIDOCAINE HCL (PF) 1 % IJ SOLN
30.0000 mL | INTRAMUSCULAR | Status: DC | PRN
  Administered 2013-07-09: 30 mL via SUBCUTANEOUS
  Filled 2013-07-09: qty 30

## 2013-07-09 MED ORDER — OXYTOCIN 40 UNITS IN LACTATED RINGERS INFUSION - SIMPLE MED
62.5000 mL/h | INTRAVENOUS | Status: DC
  Administered 2013-07-09: 62.5 mL/h via INTRAVENOUS
  Filled 2013-07-09: qty 1000

## 2013-07-09 MED ORDER — ZOLPIDEM TARTRATE 5 MG PO TABS: 5.0000 mg | ORAL_TABLET | Freq: Every evening | ORAL | Status: DC | PRN

## 2013-07-09 MED ORDER — LACTATED RINGERS IV SOLN: INTRAVENOUS | Status: DC

## 2013-07-09 MED ORDER — LACTATED RINGERS IV SOLN: 500.0000 mL | INTRAVENOUS | Status: DC | PRN

## 2013-07-09 MED ORDER — FENTANYL CITRATE 0.05 MG/ML IJ SOLN
100.0000 ug | INTRAMUSCULAR | Status: DC | PRN
  Administered 2013-07-09: 100 ug via INTRAVENOUS
  Filled 2013-07-09: qty 2

## 2013-07-09 MED ORDER — DIPHENHYDRAMINE HCL 25 MG PO CAPS: 25.0000 mg | ORAL_CAPSULE | Freq: Four times a day (QID) | ORAL | Status: DC | PRN

## 2013-07-09 MED ORDER — EPHEDRINE 5 MG/ML INJ
10.0000 mg | INTRAVENOUS | Status: DC | PRN
  Filled 2013-07-09: qty 2

## 2013-07-09 MED ORDER — IBUPROFEN 600 MG PO TABS
600.0000 mg | ORAL_TABLET | Freq: Four times a day (QID) | ORAL | Status: DC
  Administered 2013-07-09 – 2013-07-10 (×5): 600 mg via ORAL
  Filled 2013-07-09 (×6): qty 1

## 2013-07-09 MED ORDER — FENTANYL CITRATE 0.05 MG/ML IJ SOLN
INTRAMUSCULAR | Status: AC
  Filled 2013-07-09: qty 2

## 2013-07-09 MED ORDER — OXYCODONE-ACETAMINOPHEN 5-325 MG PO TABS: 1.0000 | ORAL_TABLET | ORAL | Status: DC | PRN

## 2013-07-09 MED ORDER — LACTATED RINGERS IV SOLN: 500.0000 mL | Freq: Once | INTRAVENOUS | Status: DC

## 2013-07-09 MED ORDER — CITRIC ACID-SODIUM CITRATE 334-500 MG/5ML PO SOLN: 30.0000 mL | ORAL | Status: DC | PRN

## 2013-07-09 MED ORDER — ONDANSETRON HCL 4 MG/2ML IJ SOLN: 4.0000 mg | INTRAMUSCULAR | Status: DC | PRN

## 2013-07-09 MED ORDER — FENTANYL CITRATE 0.05 MG/ML IJ SOLN
100.0000 ug | Freq: Once | INTRAMUSCULAR | Status: AC
  Administered 2013-07-09: 100 ug via INTRAVENOUS

## 2013-07-09 MED ORDER — SENNOSIDES-DOCUSATE SODIUM 8.6-50 MG PO TABS
2.0000 | ORAL_TABLET | ORAL | Status: DC
  Filled 2013-07-09: qty 2

## 2013-07-09 MED ORDER — WITCH HAZEL-GLYCERIN EX PADS: 1.0000 "application " | MEDICATED_PAD | CUTANEOUS | Status: DC | PRN

## 2013-07-09 MED ORDER — TETANUS-DIPHTH-ACELL PERTUSSIS 5-2.5-18.5 LF-MCG/0.5 IM SUSP: 0.5000 mL | Freq: Once | INTRAMUSCULAR | Status: DC

## 2013-07-09 MED ORDER — ACETAMINOPHEN 325 MG PO TABS: 650.0000 mg | ORAL_TABLET | ORAL | Status: DC | PRN

## 2013-07-09 MED ORDER — PRENATAL MULTIVITAMIN CH
1.0000 | ORAL_TABLET | Freq: Every day | ORAL | Status: DC
  Administered 2013-07-10: 1 via ORAL
  Filled 2013-07-09: qty 1

## 2013-07-09 MED ORDER — FENTANYL 2.5 MCG/ML BUPIVACAINE 1/10 % EPIDURAL INFUSION (WH - ANES): 14.0000 mL/h | INTRAMUSCULAR | Status: DC | PRN

## 2013-07-09 MED ORDER — SODIUM CHLORIDE 0.9 % IV SOLN
2.0000 g | Freq: Once | INTRAVENOUS | Status: AC
  Administered 2013-07-09: 2 g via INTRAVENOUS
  Filled 2013-07-09: qty 2000

## 2013-07-09 MED ORDER — DIPHENHYDRAMINE HCL 50 MG/ML IJ SOLN: 12.5000 mg | INTRAMUSCULAR | Status: DC | PRN

## 2013-07-09 MED ORDER — BENZOCAINE-MENTHOL 20-0.5 % EX AERO
1.0000 "application " | INHALATION_SPRAY | CUTANEOUS | Status: DC | PRN
  Filled 2013-07-09: qty 56

## 2013-07-09 NOTE — H&P (Signed)
Tonya Burns is a 23 y.o. female G2P0100 at 89110w2d presenting for advanced labor with SROM  PNC at FT significant for hx cx weakness and PTB at 22 wk, early NND;  PTCD at 19 wks this preg> prometrium, 17-P, BMZ, MgSO4 for neuroprophylaxis  Maternal Medical History:  Reason for admission: Nausea.    OB History   Grav Para Term Preterm Abortions TAB SAB Ect Mult Living   2 1 0 1 0 0 0 0 0 0      Past Medical History  Diagnosis Date  . Pregnant   . Pregnant 12/30/2012   Past Surgical History  Procedure Laterality Date  . No past surgeries     Family History: family history includes Hypertension in her father. Social History:  reports that she has quit smoking. Her smoking use included Cigarettes. She smoked 0.00 packs per day. She has never used smokeless tobacco. She reports that she does not drink alcohol or use illicit drugs.   Prenatal Transfer Tool  Maternal Diabetes: No Genetic Screening: Normal Maternal Ultrasounds/Referrals: Normal Fetal Ultrasounds or other Referrals:  None Maternal Substance Abuse:  No Significant Maternal Medications:  None Significant Maternal Lab Results:  Lab values include: Other: GBS was neg over 5 wks ago Other Comments:  IV AMP  Received BMX, MgSO4 for neuroprophylaxis at 31 wks  Review of Systems  Constitutional: Negative for fever.  Gastrointestinal: Positive for abdominal pain. Negative for nausea and vomiting.  Neurological: Negative for headaches.  Psychiatric/Behavioral: Negative for depression.    Dilation: 8 Effacement (%): 90 Station: -1 Exam by:: Roxan Hockey. Robinson RN Blood pressure 106/60, pulse 91, temperature 98.2 F (36.8 C), temperature source Oral, resp. rate 22, last menstrual period 09/20/2012. Maternal Exam:  Uterine Assessment: Contraction strength is firm.  assessing  Abdomen: Estimated fetal weight is 6#.   Fetal presentation: vertex  Introitus: Normal vulva. Normal vagina.  Ferning test: positive.  Nitrazine test:  not done.  Pelvis: adequate for delivery.   Cervix: Cervix evaluated by digital exam.     Fetal Exam Fetal Monitor Review: Mode: ultrasound.   Baseline rate: 140.  Variability: moderate (6-25 bpm).   Assessing FHR tracing  Fetal State Assessment: Category I - tracings are normal.     Physical Exam  Constitutional: She is oriented to person, place, and time. She appears well-developed and well-nourished. She appears distressed.  HENT:  Head: Normocephalic.  Eyes: Conjunctivae are normal. Pupils are equal, round, and reactive to light.  Neck: Normal range of motion. Neck supple.  Cardiovascular: Normal rate, regular rhythm and normal heart sounds.   Respiratory: Effort normal and breath sounds normal.  GI: Soft. She exhibits distension. There is tenderness.  Genitourinary: Vagina normal.  No genital HSV lesion  Musculoskeletal: Normal range of motion.  Neurological: She is alert and oriented to person, place, and time. She has normal reflexes.  Skin: Skin is warm and dry.  Psychiatric:  anxious    Prenatal labs: ABO, Rh: A/POS/-- (09/22 1255) Antibody: NEG (01/27 0827) Rubella: 1.95 (09/22 1255) RPR: NON REAC (01/27 0827)  HBsAg: NEGATIVE (09/22 1255)  HIV: NON REACTIVE (01/27 0827)  GBS: NEGATIVE (02/25 1053)  2 hr GTT: 65-95  Assessment/Plan: 23 yo G2P0100 at 59110w2d in advanced labor > IV Fentanyl IV AMP for unknown recent GBS status Expect NSVD   Thurman Sarver 07/09/2013, 10:44 AM

## 2013-07-09 NOTE — MAU Note (Signed)
Intense uc's since 0900, ? SROM @ 0830, clear fluid, no bleeding.

## 2013-07-09 NOTE — H&P (Signed)
Attestation of Attending Supervision of Advanced Practitioner (CNM/NP): Evaluation and management procedures were performed by the Advanced Practitioner under my supervision and collaboration.  I have reviewed the Advanced Practitioner's note and chart, and I agree with the management and plan.  HARRAWAY-SMITH, Melisse Caetano 2:18 PM

## 2013-07-09 NOTE — Progress Notes (Signed)
Patient ID: Tonya Burns, female   DOB: 06-May-1990, 23 y.o.   MRN: [id number] Tonya Burns is a 23 y.o. G2P0100 at 6181w2d admitted for labor Subjective: Fentanyl helped and wants more.   Objective: BP 143/74  Pulse 95  Temp(Src) 98.5 F (36.9 C) (Oral)  Resp 98  Ht 5\' 2"  (1.575 m)  Wt 74.844 kg (165 lb)  BMI 30.17 kg/m2  LMP 09/20/2012  Fetal Heart FHR: 145-150 bpm, variability: moderate,  accelerations:  Present,  decelerations:  Absent   Contractions: q 2-3  SVE:   Dilation: 8 Effacement (%): 100 Station: 0 Exam by:: Rindi Beechy, cnm 12:20 SVE: ant lip/100/-1 to 0 AROM forebag> clear  Assessment / Plan:  Labor: active phase progressing Fetal Wellbeing: Category 1 Pain Control:  declilned epidural Will give Fentanyl 50 mg IV Expected mode of delivery: NSVD  Carl Butner 07/09/2013, 12:22 PM

## 2013-07-09 NOTE — MAU Provider Note (Signed)
Attestation of Attending Supervision of Advanced Practitioner (CNM/NP): Evaluation and management procedures were performed by the Advanced Practitioner under my supervision and collaboration.  I have reviewed the Advanced Practitioner's note and chart, and I agree with the management and plan.  HARRAWAY-SMITH, Indalecio Malmstrom 2:18 PM     

## 2013-07-09 NOTE — MAU Provider Note (Signed)
G2P0100 at 36wd in active labor with SROM> See admission H&P

## 2013-07-10 ENCOUNTER — Encounter (HOSPITAL_COMMUNITY): Payer: Self-pay | Admitting: Advanced Practice Midwife

## 2013-07-10 MED ORDER — IBUPROFEN 600 MG PO TABS: 600.0000 mg | ORAL_TABLET | Freq: Four times a day (QID) | ORAL | Status: DC | PRN

## 2013-07-10 NOTE — Lactation Note (Signed)
This note was copied from the chart of Tonya Felicity CoyerJazmine Peragine. Lactation Consultation Note Baby poor feeder, not fed well since birth. Difficulty latching, noted flat small nipple. Has hand pump, encouraged to use prior to feeding. #20 Nipple shield given. Demonstrated application and hand expression. Noted colostrum. Baby not interested in feeding at this time. Mom gave 5ml formula because she didn't think she has milk. Happy to see colostrum. Shells given to wear in AM to encourage nipples to come out. WH/LC brochure given w/resources, support groups and LC services. Encouraged comfort during BF so colostrum flows better and mom will enjoy the feeding longer. Taking deep breaths and breast massage during BF. Encouraged to call for assistance if needed and to verify proper latch. Patient Name: Tonya Burns NWGNF'AToday's Date: 07/10/2013 Reason for consult: Initial assessment;Difficult latch   Maternal Data    Feeding Feeding Type: Breast Fed Nipple Type: Slow - flow Length of feed: 0 min  LATCH Score/Interventions Latch: Too sleepy or reluctant, no latch achieved, no sucking elicited. Intervention(s): Skin to skin;Teach feeding cues;Waking techniques Intervention(s): Adjust position;Assist with latch;Breast massage;Breast compression  Audible Swallowing: None Intervention(s): Skin to skin;Hand expression Intervention(s): Skin to skin;Hand expression;Alternate breast massage  Type of Nipple: Flat Intervention(s): Shells;Hand pump (nipple shield #20)  Comfort (Breast/Nipple): Soft / non-tender     Hold (Positioning): Assistance needed to correctly position infant at breast and maintain latch.  LATCH Score: 4  Lactation Tools Discussed/Used Tools: Shells;Nipple Dorris CarnesShields;Pump Nipple shield size: 20 Shell Type: Inverted Breast pump type: Manual   Consult Status Consult Status: Follow-up Date: 07/10/13 Follow-up type: In-patient    Orpheus [place], Diamond NickelLAURA G 07/10/2013, 1:17 AM

## 2013-07-10 NOTE — Discharge Instructions (Signed)
Breastfeeding °Deciding to breastfeed is one of the best choices you can make for you and your baby. A change in hormones during pregnancy causes your breast tissue to grow and increases the number and size of your milk ducts. These hormones also allow proteins, sugars, and fats from your blood supply to make breast milk in your milk-producing glands. Hormones prevent breast milk from being released before your baby is born as well as prompt milk flow after birth. Once breastfeeding has begun, thoughts of your baby, as well as his or her sucking or crying, can stimulate the release of milk from your milk-producing glands.  °BENEFITS OF BREASTFEEDING °For Your Baby °· Your first milk (colostrum) helps your baby's digestive system function better.   °· There are antibodies in your milk that help your baby fight off infections.   °· Your baby has a lower incidence of asthma, allergies, and sudden infant death syndrome.   °· The nutrients in breast milk are better for your baby than infant formulas and are designed uniquely for your baby's needs.   °· Breast milk improves your baby's brain development.   °· Your baby is less likely to develop other conditions, such as childhood obesity, asthma, or type 2 diabetes mellitus.   °For You  °· Breastfeeding helps to create a very special bond between you and your baby.   °· Breastfeeding is convenient. Breast milk is always available at the correct temperature and costs nothing.   °· Breastfeeding helps to burn calories and helps you lose the weight gained during pregnancy.   °· Breastfeeding makes your uterus contract to its prepregnancy size faster and slows bleeding (lochia) after you give birth.   °· Breastfeeding helps to lower your risk of developing type 2 diabetes mellitus, osteoporosis, and breast or ovarian cancer later in life. °SIGNS THAT YOUR BABY IS HUNGRY °Early Signs of Hunger  °· Increased alertness or activity. °· Stretching. °· Movement of the head from  side to side. °· Movement of the head and opening of the mouth when the corner of the mouth or cheek is stroked (rooting). °· Increased sucking sounds, smacking lips, cooing, sighing, or squeaking. °· Hand-to-mouth movements. °· Increased sucking of fingers or hands. °Late Signs of Hunger °· Fussing. °· Intermittent crying. °Extreme Signs of Hunger °Signs of extreme hunger will require calming and consoling before your baby will be able to breastfeed successfully. Do not wait for the following signs of extreme hunger to occur before you initiate breastfeeding:   °· Restlessness. °· A loud, strong cry. °·  Screaming. °BREASTFEEDING BASICS °Breastfeeding Initiation °· Find a comfortable place to sit or lie down, with your neck and back well supported. °· Place a pillow or rolled up blanket under your baby to bring him or her to the level of your breast (if you are seated). Nursing pillows are specially designed to help support your arms and your baby while you breastfeed. °· Make sure that your baby's abdomen is facing your abdomen.   °· Gently massage your breast. With your fingertips, massage from your chest wall toward your nipple in a circular motion. This encourages milk flow. You may need to continue this action during the feeding if your milk flows slowly. °· Support your breast with 4 fingers underneath and your thumb above your nipple. Make sure your fingers are well away from your nipple and your baby's mouth.   °· Stroke your baby's lips gently with your finger or nipple.   °· When your baby's mouth is open wide enough, quickly bring your baby to your   breast, placing your entire nipple and as much of the colored area around your nipple (areola) as possible into your baby's mouth.   °· More areola should be visible above your baby's upper lip than below the lower lip.   °· Your baby's tongue should be between his or her lower gum and your breast.   °· Ensure that your baby's mouth is correctly positioned  around your nipple (latched). Your baby's lips should create a seal on your breast and be turned out (everted). °· It is common for your baby to suck about 2 3 minutes in order to start the flow of breast milk. °Latching °Teaching your baby how to latch on to your breast properly is very important. An improper latch can cause nipple pain and decreased milk supply for you and poor weight gain in your baby. Also, if your baby is not latched onto your nipple properly, he or she may swallow some air during feeding. This can make your baby fussy. Burping your baby when you switch breasts during the feeding can help to get rid of the air. However, teaching your baby to latch on properly is still the best way to prevent fussiness from swallowing air while breastfeeding. °Signs that your baby has successfully latched on to your nipple:    °· Silent tugging or silent sucking, without causing you pain.   °· Swallowing heard between every 3 4 sucks.   °·  Muscle movement above and in front of his or her ears while sucking.   °Signs that your baby has not successfully latched on to nipple:  °· Sucking sounds or smacking sounds from your baby while breastfeeding. °· Nipple pain. °If you think your baby has not latched on correctly, slip your finger into the corner of your baby's mouth to break the suction and place it between your baby's gums. Attempt breastfeeding initiation again. °Signs of Successful Breastfeeding °Signs from your baby:   °· A gradual decrease in the number of sucks or complete cessation of sucking.   °· Falling asleep.   °· Relaxation of his or her body.   °· Retention of a small amount of milk in his or her mouth.   °· Letting go of your breast by himself or herself. °Signs from you: °· Breasts that have increased in firmness, weight, and size 1 3 hours after feeding.   °· Breasts that are softer immediately after breastfeeding. °· Increased milk volume, as well as a change in milk consistency and color by  the 5th day of breastfeeding.   °· Nipples that are not sore, cracked, or bleeding. °Signs That Your Baby is Getting Enough Milk °· Wetting at least 3 diapers in a 24-hour period. The urine should be clear and pale yellow by age 5 days. °· At least 3 stools in a 24-hour period by age 5 days. The stool should be soft and yellow. °· At least 3 stools in a 24-hour period by age 7 days. The stool should be seedy and yellow. °· No loss of weight greater than 10% of birth weight during the first 3 days of age. °· Average weight gain of 4 7 ounces (120 210 mL) per week after age 4 days. °· Consistent daily weight gain by age 5 days, without weight loss after the age of 2 weeks. °After a feeding, your baby may spit up a small amount. This is common. °BREASTFEEDING FREQUENCY AND DURATION °Frequent feeding will help you make more milk and can prevent sore nipples and breast engorgement. Breastfeed when you feel the need to reduce   the fullness of your breasts or when your baby shows signs of hunger. This is called "breastfeeding on demand." Avoid introducing a pacifier to your baby while you are working to establish breastfeeding (the first 4 6 weeks after your baby is born). After this time you may choose to use a pacifier. Research has shown that pacifier use during the first year of a baby's life decreases the risk of sudden infant death syndrome (SIDS). °Allow your baby to feed on each breast as long as he or she wants. Breastfeed until your baby is finished feeding. When your baby unlatches or falls asleep while feeding from the first breast, offer the second breast. Because newborns are often sleepy in the first few weeks of life, you may need to awaken your baby to get him or her to feed. °Breastfeeding times will vary from baby to baby. However, the following rules can serve as a guide to help you ensure that your baby is properly fed: °· Newborns (babies 4 weeks of age or younger) may breastfeed every 1 3  hours. °· Newborns should not go longer than 3 hours during the day or 5 hours during the night without breastfeeding. °· You should breastfeed your baby a minimum of 8 times in a 24-hour period until you begin to introduce solid foods to your baby at around 6 months of age. °BREAST MILK PUMPING °Pumping and storing breast milk allows you to ensure that your baby is exclusively fed your breast milk, even at times when you are unable to breastfeed. This is especially important if you are going back to work while you are still breastfeeding or when you are not able to be present during feedings. Your lactation consultant can give you guidelines on how long it is safe to store breast milk.  °A breast pump is a machine that allows you to pump milk from your breast into a sterile bottle. The pumped breast milk can then be stored in a refrigerator or freezer. Some breast pumps are operated by hand, while others use electricity. Ask your lactation consultant which type will work best for you. Breast pumps can be purchased, but some hospitals and breastfeeding support groups lease breast pumps on a monthly basis. A lactation consultant can teach you how to hand express breast milk, if you prefer not to use a pump.  °CARING FOR YOUR BREASTS WHILE YOU BREASTFEED °Nipples can become dry, cracked, and sore while breastfeeding. The following recommendations can help keep your breasts moisturized and healthy: °· Avoid using soap on your nipples.   °· Wear a supportive bra. Although not required, special nursing bras and tank tops are designed to allow access to your breasts for breastfeeding without taking off your entire bra or top. Avoid wearing underwire style bras or extremely tight bras. °· Air dry your nipples for 3 4 minutes after each feeding.   °· Use only cotton bra pads to absorb leaked breast milk. Leaking of breast milk between feedings is normal.   °· Use lanolin on your nipples after breastfeeding. Lanolin helps to  maintain your skin's normal moisture barrier. If you use pure lanolin you do not need to wash it off before feeding your baby again. Pure lanolin is not toxic to your baby. You may also hand express a few drops of breast milk and gently massage that milk into your nipples and allow the milk to air dry. °In the first few weeks after giving birth, some women experience extremely full breasts (engorgement). Engorgement can make   your breasts feel heavy, warm, and tender to the touch. Engorgement peaks within 3 5 days after you give birth. The following recommendations can help ease engorgement: °· Completely empty your breasts while breastfeeding or pumping. You may want to start by applying warm, moist heat (in the shower or with warm water-soaked hand towels) just before feeding or pumping. This increases circulation and helps the milk flow. If your baby does not completely empty your breasts while breastfeeding, pump any extra milk after he or she is finished. °· Wear a snug bra (nursing or regular) or tank top for 1 2 days to signal your body to slightly decrease milk production. °· Apply ice packs to your breasts, unless this is too uncomfortable for you. °· Make sure that your baby is latched on and positioned properly while breastfeeding. °If engorgement persists after 48 hours of following these recommendations, contact your health care provider or a lactation consultant. °OVERALL HEALTH CARE RECOMMENDATIONS WHILE BREASTFEEDING °· Eat healthy foods. Alternate between meals and snacks, eating 3 of each per day. Because what you eat affects your breast milk, some of the foods may make your baby more irritable than usual. Avoid eating these foods if you are sure that they are negatively affecting your baby. °· Drink milk, fruit juice, and water to satisfy your thirst (about 10 glasses a day).   °· Rest often, relax, and continue to take your prenatal vitamins to prevent fatigue, stress, and anemia. °· Continue  breast self-awareness checks. °· Avoid chewing and smoking tobacco. °· Avoid alcohol and drug use. °Some medicines that may be harmful to your baby can pass through breast milk. It is important to ask your health care provider before taking any medicine, including all over-the-counter and prescription medicine as well as vitamin and herbal supplements. °It is possible to become pregnant while breastfeeding. If birth control is desired, ask your health care provider about options that will be safe for your baby. °SEEK MEDICAL CARE IF:  °· You feel like you want to stop breastfeeding or have become frustrated with breastfeeding. °· You have painful breasts or nipples. °· Your nipples are cracked or bleeding. °· Your breasts are red, tender, or warm. °· You have a swollen area on either breast. °· You have a fever or chills. °· You have nausea or vomiting. °· You have drainage other than breast milk from your nipples. °· Your breasts do not become full before feedings by the 5th day after you give birth. °· You feel sad and depressed. °· Your baby is too sleepy to eat well. °· Your baby is having trouble sleeping.   °· Your baby is wetting less than 3 diapers in a 24-hour period. °· Your baby has less than 3 stools in a 24-hour period. °· Your baby's skin or the white part of his or her eyes becomes yellow.   °· Your baby is not gaining weight by 5 days of age. °SEEK IMMEDIATE MEDICAL CARE IF:  °· Your baby is overly tired (lethargic) and does not want to wake up and feed. °· Your baby develops an unexplained fever. °Document Released: 03/27/2005 Document Revised: 11/27/2012 Document Reviewed: 09/18/2012 °ExitCare® Patient Information ©2014 [employer]. ° °Vaginal Delivery °Care After °Refer to this sheet in the next few weeks. These discharge instructions provide you with information on caring for yourself after delivery. Your caregiver may also give you specific instructions. Your treatment has been planned  according to the most current medical practices available, but problems sometimes occur.   Call your caregiver if you have any problems or questions after you go home. °HOME CARE INSTRUCTIONS °· Take over-the-counter or prescription medicines only as directed by your caregiver or pharmacist. °· Do not drink alcohol, especially if you are breastfeeding or taking medicine to relieve pain. °· Do not chew or smoke tobacco. °· Do not use illegal drugs. °· Continue to use good perineal care. Good perineal care includes: °· Wiping your perineum from front to back. °· Keeping your perineum clean. °· Do not use tampons or douche until your caregiver says it is okay. °· Shower, wash your hair, and take tub baths as directed by your caregiver. °· Wear a well-fitting bra that provides breast support. °· Eat healthy foods. °· Drink enough fluids to keep your urine clear or pale yellow. °· Eat high-fiber foods such as whole grain cereals and breads, brown rice, beans, and fresh fruits and vegetables every day. These foods may help prevent or relieve constipation. °· Follow your cargiver's recommendations regarding resumption of activities such as climbing stairs, driving, lifting, exercising, or traveling. °· Talk to your caregiver about resuming sexual activities. Resumption of sexual activities is dependent upon your risk of infection, your rate of healing, and your comfort and desire to resume sexual activity. °· Try to have someone help you with your household activities and your newborn for at least a few days after you leave the hospital. °· Rest as much as possible. Try to rest or take a nap when your newborn is sleeping. °· Increase your activities gradually. °· Keep all of your scheduled postpartum appointments. It is very important to keep your scheduled follow-up appointments. At these appointments, your caregiver will be checking to make sure that you are healing physically and emotionally. °SEEK MEDICAL CARE IF:   °· You are passing large clots from your vagina. Save any clots to show your caregiver. °· You have a foul smelling discharge from your vagina. °· You have trouble urinating. °· You are urinating frequently. °· You have pain when you urinate. °· You have a change in your bowel movements. °· You have increasing redness, pain, or swelling near your vaginal incision (episiotomy) or vaginal tear. °· You have pus draining from your episiotomy or vaginal tear. °· Your episiotomy or vaginal tear is separating. °· You have painful, hard, or reddened breasts. °· You have a severe headache. °· You have blurred vision or see spots. °· You feel sad or depressed. °· You have thoughts of hurting yourself or your newborn. °· You have questions about your care, the care of your newborn, or medicines. °· You are dizzy or lightheaded. °· You have a rash. °· You have nausea or vomiting. °· You were breastfeeding and have not had a menstrual period within 12 weeks after you stopped breastfeeding. °· You are not breastfeeding and have not had a menstrual period by the 12th week after delivery. °· You have a fever. °SEEK IMMEDIATE MEDICAL CARE IF:  °· You have persistent pain. °· You have chest pain. °· You have shortness of breath. °· You faint. °· You have leg pain. °· You have stomach pain. °· Your vaginal bleeding saturates two or more sanitary pads in 1 hour. °MAKE SURE YOU:  °· Understand these instructions. °· Will watch your condition. °· Will get help right away if you are not doing well or get worse. °Document Released: 03/24/2000 Document Revised: 12/20/2011 Document Reviewed: 11/22/2011 °ExitCare® Patient Information ©2014 [employer]. ° °

## 2013-07-10 NOTE — Discharge Summary (Signed)
Attestation of Attending Supervision of Advanced Practitioner (CNM/NP): Evaluation and management procedures were performed by the Advanced Practitioner under my supervision and collaboration.  I have reviewed the Advanced Practitioner's note and chart, and I agree with the management and plan.  HARRAWAY-SMITH, Axle Parfait 1:34 PM

## 2013-07-10 NOTE — Progress Notes (Signed)
Post Partum Day 1 Subjective: no complaints, up ad lib, voiding and tolerating PO, had BM. Improved vaginal bleeding. Minimal abdominal pain and cramping, improved with ibuprofen. Understands baby will likely remain in hospital for several days. Undecided on birth control, previously on OCP.  Objective: Blood pressure 100/50, pulse 91, temperature 98.3 F (36.8 C), temperature source Oral, resp. rate 17, height 5\' 2"  (1.575 m), weight 74.844 kg (165 lb), last menstrual period 09/20/2012, SpO2 99.00%, unknown if currently breastfeeding.  Physical Exam:  General: alert and cooperative, well appearing Lochia: appropriate Uterine Fundus: firm U -2 DVT Evaluation: No evidence of DVT seen on physical exam. Negative Homan's sign. No cords or calf tenderness. No significant calf/ankle edema.   Recent Labs  07/09/13 1050  HGB 11.8*  HCT 34.5*    Assessment/Plan: Plan for discharge tomorrow, Breastfeeding and Contraception undecided (prior OCP). - Due to prematurity 1043w2d, baby will likely remain hospitalized for multiple days, even if Mom is discharged PPD#1-2. - Plan for outpatient circumcision, given pre-term infant   LOS: 1 day   Saralyn PilarAlexander Deja Kaigler, DO [facility], PGY-1 07/10/2013, 7:30 AM

## 2013-07-10 NOTE — Discharge Summary (Signed)
Obstetric Discharge Summary Reason for Admission: onset of labor and rupture of membranes Prenatal Procedures: ultrasound Intrapartum Procedures: vacuum and GBS prophylaxis Postpartum Procedures: none Complications-Operative and Postpartum: 2 degree perineal laceration Hemoglobin  Date Value Ref Range Status  07/09/2013 11.8* 12.0 - 15.0 g/dL Final     HCT  Date Value Ref Range Status  07/09/2013 34.5* 36.0 - 46.0 % Final    Physical Exam:  General: alert, cooperative and no distress Lochia: appropriate Uterine Fundus: firm Incision: NA DVT Evaluation: Negative Homan's sign.  Discharge Diagnoses: Preterm delivery  Discharge Information: Date: 07/10/2013 Activity: unrestricted and pelvic rest Diet: routine Medications: PNV and Ibuprofen Condition: stable Instructions: refer to practice specific booklet Discharge to: home Follow-up Information   Follow up with [facility] In 4 weeks. (or sooner , As needed)    Contact information:   [address] [place] [id number] [phone]      [facility]. (As needed in emergencies)    Contact information:   [address] [id number] [place] [id number] [phone]      Newborn Data: Live born female  Birth Weight: 5 lb 5.4 oz (2421 g) APGAR: 8, 8 GBS was listed as unknown because pt was delivering quickly and CNM was unsure if GBS on file had been done w/in 5 weeks of delivery. After review of the chart GBS was actually negative exactly 5 weeks prior to delivery.   Home with mother.  Kimber Esterly 07/10/2013, 10:12 AM

## 2013-07-10 NOTE — Progress Notes (Signed)
I was present for the exam and agree with above. Discussed non-estrogen BC methods.   [place] Hyacinth Marcelli, CNM 07/10/2013 9:12 AM

## 2013-07-11 ENCOUNTER — Ambulatory Visit: Payer: Self-pay

## 2013-07-11 NOTE — Lactation Note (Signed)
This note was copied from the chart of Boy Felicity CoyerJazmine Wahler. Lactation Consultation Note  Patient Name: Boy Felicity CoyerJazmine Yarberry ZOXWR'UToday's Date: 07/11/2013  Patient has decided to feed her baby only formula. She said she received help with breastfeeding but did not "like it". She does not want to pump her milk. She states her nurse has reviewed care of her breast to manage/prevent non-nursing engorgement following discharge.  Maternal Data    Feeding Feeding Type: Bottle Fed - Formula  LATCH Score/Interventions                      Lactation Tools Discussed/Used     Consult Status      Christella HartiganDaly, Natayla Cadenhead M 07/11/2013, 9:08 AM

## 2013-07-21 ENCOUNTER — Ambulatory Visit: Payer: BC Managed Care – PPO | Admitting: Obstetrics and Gynecology

## 2013-08-12 ENCOUNTER — Ambulatory Visit: Payer: BC Managed Care – PPO | Admitting: Advanced Practice Midwife

## 2013-08-14 ENCOUNTER — Ambulatory Visit (INDEPENDENT_AMBULATORY_CARE_PROVIDER_SITE_OTHER): Payer: BC Managed Care – PPO | Admitting: Advanced Practice Midwife

## 2013-08-14 ENCOUNTER — Encounter: Payer: Self-pay | Admitting: Advanced Practice Midwife

## 2013-08-14 MED ORDER — NORGESTIMATE-ETH ESTRADIOL 0.25-35 MG-MCG PO TABS: 1.0000 | ORAL_TABLET | Freq: Every day | ORAL | Status: DC

## 2013-08-14 NOTE — Progress Notes (Signed)
  Tonya LaosJazmine E Burns is a 23 y.o. who presents for a postpartum visit. She is 5 weeks postpartum following a spontaneous vaginal delivery. I have fully reviewed the prenatal and intrapartum course. The delivery was at 36.2 gestational weeks.  Anesthesia: local. Postpartum course has been uneventful. Baby's course has been uneventful. Baby is feeding by bottle. Bleeding: probably started bleeding 2 days ago--probably her period. Bowel function is normal. Bladder function is normal. Patient is not sexually active. Contraception method is none. Postpartum depression screening: negative.    Review of Systems   Constitutional: Negative for fever and chills Eyes: Negative for visual disturbances Respiratory: Negative for shortness of breath, dyspnea Cardiovascular: Negative for chest pain or palpitations  Gastrointestinal: Negative for vomiting, diarrhea and constipation Genitourinary: Negative for dysuria and urgency Musculoskeletal: Negative for back pain, joint pain, myalgias  Neurological: Negative for dizziness and headaches   Objective:     Filed Vitals:   08/14/13 1356  BP: 120/60   General:  alert, cooperative and no distress   Breasts:  negative  Lungs: clear to auscultation bilaterally  Heart:  regular rate and rhythm  Abdomen: Soft, nontender   Vulva:  normal  Vagina: normal vagina  Cervix:  closed  Corpus: Well involuted     Rectal Exam: no hemorrhoids        Assessment:    normal postpartum exam.  Plan:    1. Contraception: OCP (estrogen/progesterone) 2. Follow up in:  as needed.

## 2013-08-19 ENCOUNTER — Telehealth: Payer: Self-pay | Admitting: Obstetrics & Gynecology

## 2013-08-19 NOTE — Telephone Encounter (Signed)
Pt needs a note to return to work on May 26,2015 with no restrictions, note left at front desk

## 2013-09-02 ENCOUNTER — Emergency Department (HOSPITAL_COMMUNITY)
Admission: EM | Admit: 2013-09-02 | Discharge: 2013-09-02 | Disposition: A | Discharge status: Home / Self Care | Payer: BC Managed Care – PPO | Attending: Emergency Medicine | Admitting: Emergency Medicine

## 2013-09-02 ENCOUNTER — Encounter (HOSPITAL_COMMUNITY): Payer: Self-pay | Admitting: Emergency Medicine

## 2013-09-02 DIAGNOSIS — R220 Localized swelling, mass and lump, head: Secondary | ICD-10-CM | POA: Insufficient documentation

## 2013-09-02 DIAGNOSIS — Z87891 Personal history of nicotine dependence: Secondary | ICD-10-CM | POA: Insufficient documentation

## 2013-09-02 DIAGNOSIS — R221 Localized swelling, mass and lump, neck: Principal | ICD-10-CM

## 2013-09-02 DIAGNOSIS — Z79899 Other long term (current) drug therapy: Secondary | ICD-10-CM | POA: Insufficient documentation

## 2013-09-02 MED ORDER — METHYLPREDNISOLONE SODIUM SUCC 125 MG IJ SOLR
125.0000 mg | Freq: Once | INTRAMUSCULAR | Status: AC
  Administered 2013-09-02: 125 mg via INTRAVENOUS
  Filled 2013-09-02: qty 2

## 2013-09-02 MED ORDER — PREDNISONE 10 MG PO TABS: 20.0000 mg | ORAL_TABLET | Freq: Two times a day (BID) | ORAL | Status: DC

## 2013-09-02 MED ORDER — FAMOTIDINE IN NACL 20-0.9 MG/50ML-% IV SOLN
20.0000 mg | Freq: Once | INTRAVENOUS | Status: AC
  Administered 2013-09-02: 20 mg via INTRAVENOUS
  Filled 2013-09-02: qty 50

## 2013-09-02 MED ORDER — DIPHENHYDRAMINE HCL 50 MG/ML IJ SOLN
25.0000 mg | Freq: Once | INTRAMUSCULAR | Status: AC
  Administered 2013-09-02: 25 mg via INTRAVENOUS
  Filled 2013-09-02: qty 1

## 2013-09-02 NOTE — ED Notes (Signed)
Pt c/o lip swelling since 2200

## 2013-09-02 NOTE — ED Provider Notes (Signed)
CSN: [id number]     Arrival date & time 09/02/13  0101 History   First MD Initiated Contact with Patient 09/02/13 0107     Chief Complaint  Patient presents with  . Allergic Reaction     (Consider location/radiation/quality/duration/timing/severity/associated sxs/prior Treatment) HPI Comments: Patient is a 23 year old female who presents with complaints of swelling to her lower lip that started earlier this evening. 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 She denies any new medications. She denies any difficulty breathing or swallowing.  Patient is a 23 y.o. female presenting with allergic reaction. The history is provided by the patient.  Allergic Reaction Presenting symptoms: swelling   Presenting symptoms: no difficulty breathing and no difficulty swallowing   Severity:  Moderate Relieved by:  Nothing Worsened by:  Nothing tried   Past Medical History  Diagnosis Date  . Pregnant   . Pregnant 12/30/2012   Past Surgical History  Procedure Laterality Date  . No past surgeries     Family History  Problem Relation Age of Onset  . Hypertension Father    History  Substance Use Topics  . Smoking status: Former Smoker    Types: Cigarettes  . Smokeless tobacco: Never Used  . Alcohol Use: Yes   OB History   Grav Para Term Preterm Abortions TAB SAB Ect Mult Living   2 2 0 2 0 0 0 0 0 1      Review of Systems  HENT: Negative for trouble swallowing.   All other systems reviewed and are negative.     Allergies  Tomato  Home Medications   Prior to Admission medications   Medication Sig Start Date End Date Taking? Authorizing Provider  ibuprofen (ADVIL,MOTRIN) 600 MG tablet Take 1 tablet (600 mg total) by mouth every 6 (six) hours as needed for moderate pain or cramping. 07/10/13  Yes Dorathy KinsmanVirginia Smith, CNM  norgestimate-ethinyl estradiol (ORTHO-CYCLEN,SPRINTEC,PREVIFEM) 0.25-35 MG-MCG tablet Take 1 tablet by mouth daily. 08/14/13   Yes Jacklyn ShellFrances Cresenzo-Dishmon, CNM  Prenatal Vit-Fe Fumarate-FA (PRENATAL MULTIVITAMIN) TABS tablet Take 1 tablet by mouth daily at 12 noon.    Historical Provider, MD   BP 99/59  Pulse 89  Temp(Src) 98.7 F (37.1 C) (Oral)  Resp 16  Ht 5\' 2"  (1.575 m)  Wt 153 lb (69.4 kg)  BMI 27.98 kg/m2  SpO2 100%  LMP 08/12/2013 Physical Exam  Nursing note and vitals reviewed. Constitutional: She is oriented to person, place, and time. She appears well-developed and well-nourished. No distress.  HENT:  Head: Normocephalic and atraumatic.  Mouth/Throat: Oropharynx is clear and moist.  There is marked swelling of the lower lip, greatest on the right side. There is no redness or evidence for trauma. Dentition appears intact and well. There is no swelling of the posterior oropharynx or tongue. There is no stridor.  Neck: Normal range of motion. Neck supple.  Cardiovascular: Normal rate, regular rhythm and normal heart sounds.   No murmur heard. Pulmonary/Chest: Effort normal and breath sounds normal. No respiratory distress. She has no wheezes.  Lymphadenopathy:    She has no cervical adenopathy.  Neurological: She is alert and oriented to person, place, and time.  Skin: Skin is warm and dry. She is not diaphoretic.    ED Course  Procedures (including critical care time) Labs Review Labs Reviewed - No data to display  Imaging Review No results found.   EKG Interpretation None      MDM  Final diagnoses:  None    Patient presents with complaints of lower lip swelling. 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 She denies any tongue swelling or swelling of her mouth or throat. She has significant swelling of her lower lip on exam that I feel as consistent with angioedema rather than a true allergic reaction. She is given antihistamines and steroids without much relief. She was observed for several hours and the swelling has not  progressed. She will be discharged with prednisone and Benadryl and when necessary return.    Geoffery Lyons, MD 09/02/13 [phone]

## 2013-09-02 NOTE — ED Notes (Signed)
Pt is allergic to tomato's, ate BBQ chicken tonight. Pt is not driving

## 2013-09-02 NOTE — Discharge Instructions (Signed)
Prednisone as prescribed.  Benadryl 50 mg every 6 hours for the next 3 days.  Return to the emergency department if you develop swelling of the tongue or inside of the mouth, and if you develop any difficulty breathing or swallowing.

## 2013-09-02 NOTE — ED Notes (Signed)
Denies tongue swelling, no SOB

## 2014-02-09 ENCOUNTER — Encounter (HOSPITAL_COMMUNITY): Payer: Self-pay | Admitting: Emergency Medicine

## 2014-07-31 NOTE — Consult Note (Signed)
  y/o G2P0110 at 22 weeks, [place] ago[place]  Suspected incompetent cervixnotes show no complications tonite c/o significant increase in flow volume today and crampingbled since deliverycurrent bleeding as constant and saturating pads no illness         NKDA         No medsall negative except as above; denies dizziness  Well appearing BF and NAD            Vital signs stable            Lungs: CTA           CVS: RRR           Extremities: NT           Abd: NT           Spec: much BRB in vagina, min active bleeding           Ring forcep easily admitted, with organized mass of tissue, approx 4x 2.5 cm, removed           Bimanual showed firming uterus  24y/o BF at 4 weeks post 22 week delivery        Heavy bleeding as result of menstrual cycle vs retained product        Tissue removed in ED         Will send home with Rx for Doxy 100mg  BID for 3 days and Methergine tid for 3 days        recommend F/U next week either with me or with OB provider          Electronic Signatures: Margaretha GlassingEvans, Ricky L (MD)  (Signed on 21-Feb-14 21:50)  Authored  Last Updated: 21-Feb-14 21:50 by Margaretha GlassingEvans, Ricky L (MD)

## 2014-07-31 NOTE — Discharge Summary (Signed)
Dates of Admission and Diagnosis:  Date of Admission 31-May-2012   Date of Discharge 31-May-2012   Admitting Diagnosis Vaginal bleeding   Final Diagnosis ibid   Discharge Diagnosis 1 menstrual cycle   2 possible retained products    Chief Complaint/History of Present Illness bleeding at 4 weeks after PTL&D   Hospital Course:  Hospital Course tissue removed from upper cervix led to decreased bleeding   Condition on Discharge Serious   DISCHARGE INSTRUCTIONS HOME MEDS:  Medication Reconciliation: Patient's Home Medications at Discharge:     Physician's Instructions:  Diet Regular   Activity Limitations no sex for 2 weeks   Return to Work 2 weeks   Time frame for Follow Up Appointment 1-2 weeks   Electronic Signatures: Margaretha GlassingEvans, Ricky L (MD)  (Signed 21-Feb-14 21:56)  Authored: ADMISSION DATE AND DIAGNOSIS, CHIEF COMPLAINT/HPI, HOSPITAL COURSE, DISCHARGE INSTRUCTIONS HOME MEDS, PATIENT INSTRUCTIONS   Last Updated: 21-Feb-14 21:56 by Margaretha GlassingEvans, Ricky L (MD)

## 2014-09-06 ENCOUNTER — Other Ambulatory Visit: Payer: Self-pay | Admitting: Advanced Practice Midwife

## 2014-10-21 IMAGING — US US OB LIMITED
1 series · 13 of 28 positions shown · non-contrast
Comparison: none

[Series 1: us ob limited · 13 of 31 slices shown]
[im 2/31]
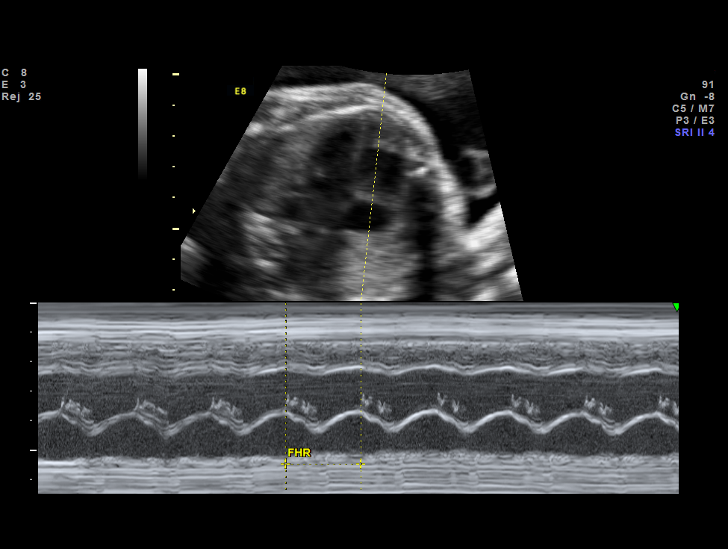
[im 4/31]
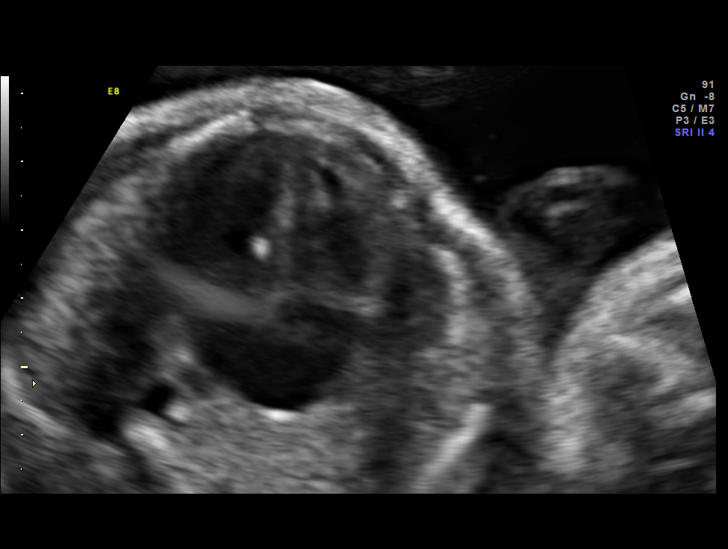
[im 6/31]
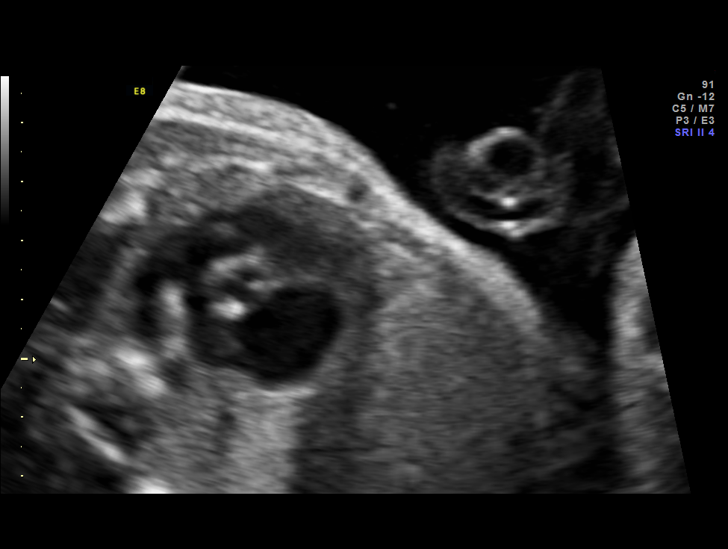
[im 8/31]
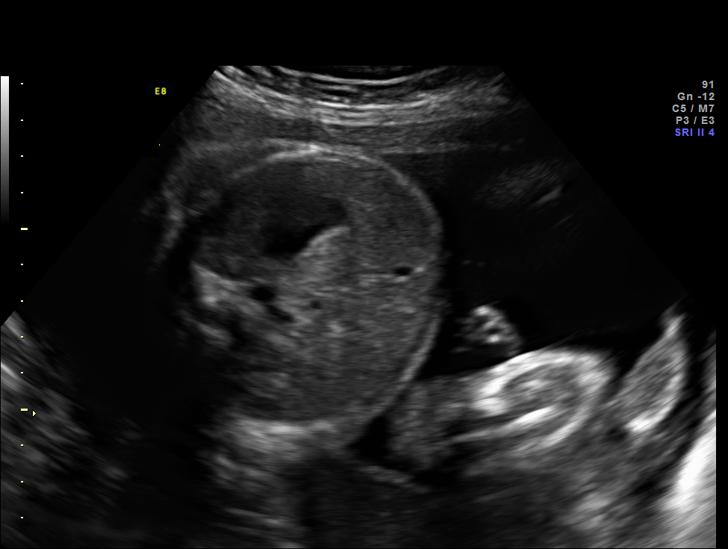
[im 11/31]
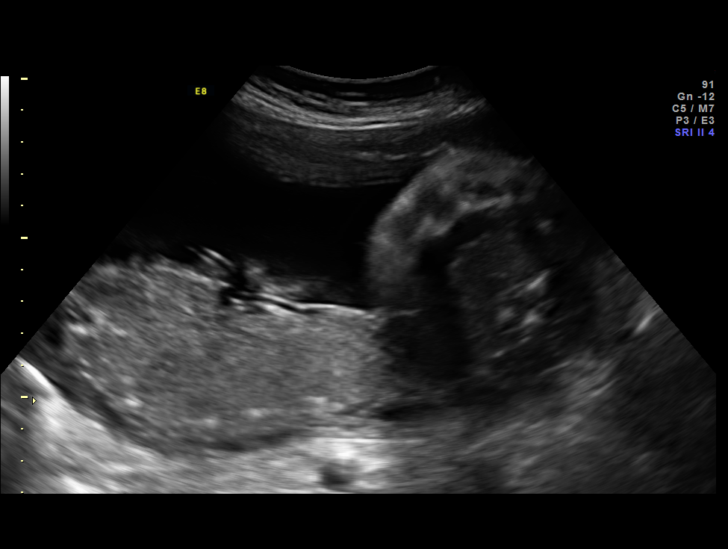
[im 13/31]
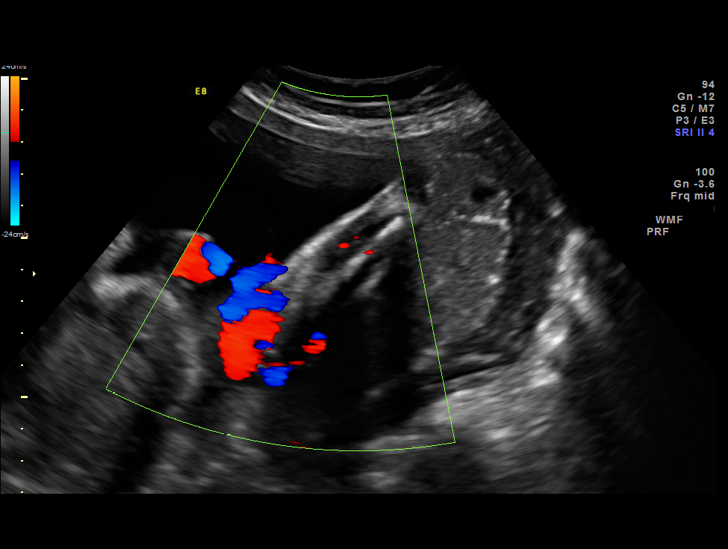
[im 16/31]
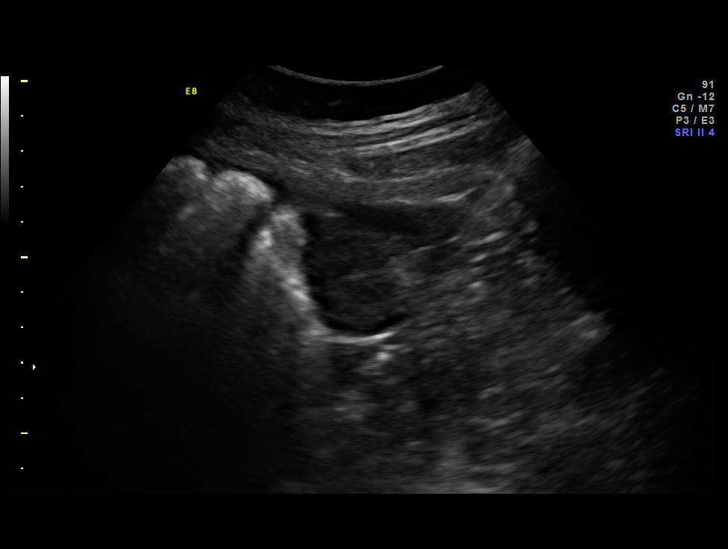
[im 18/31]
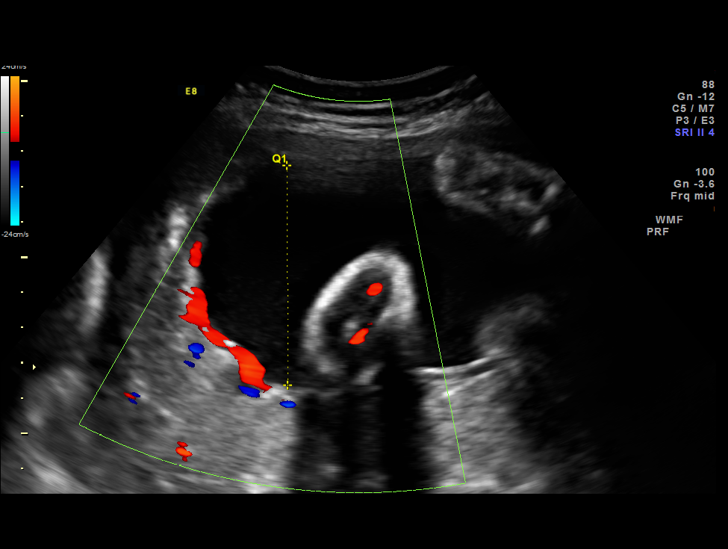
[im 21/31]
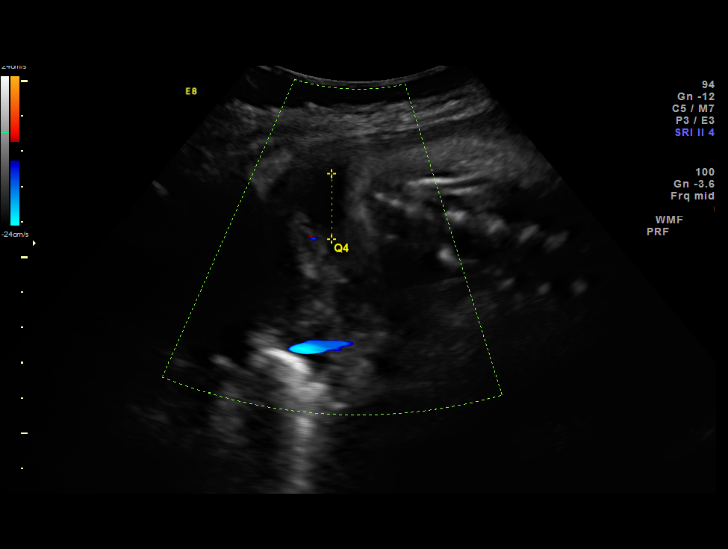
[im 23/31]
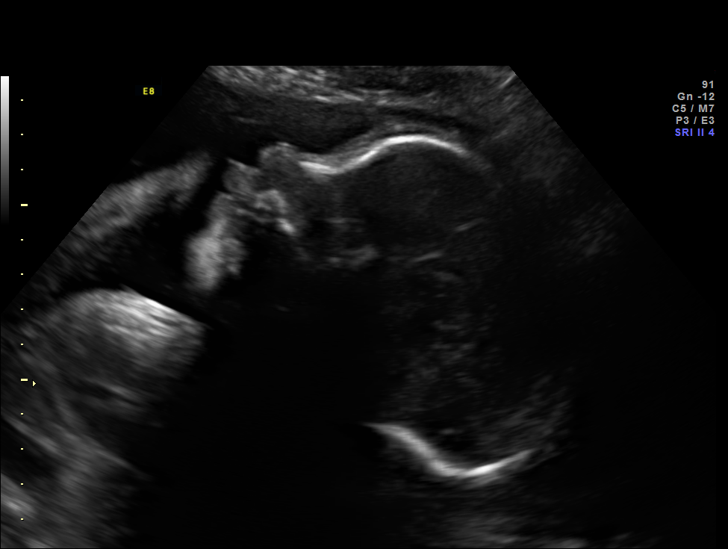
[im 25/31]
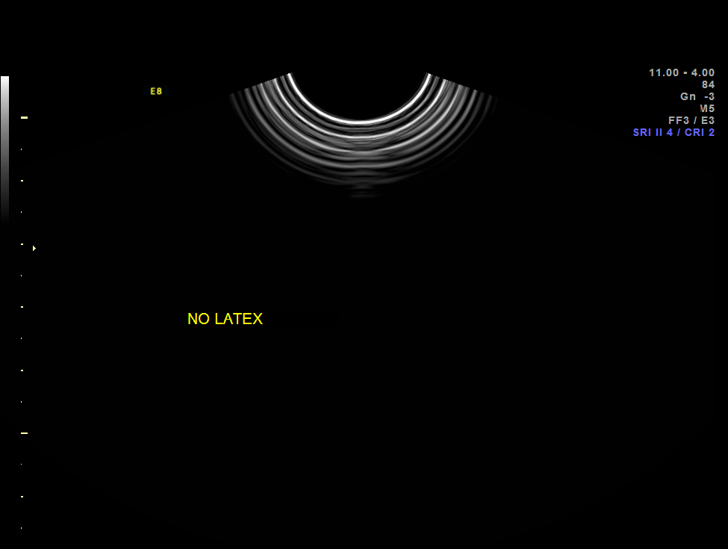
[im 27/31]
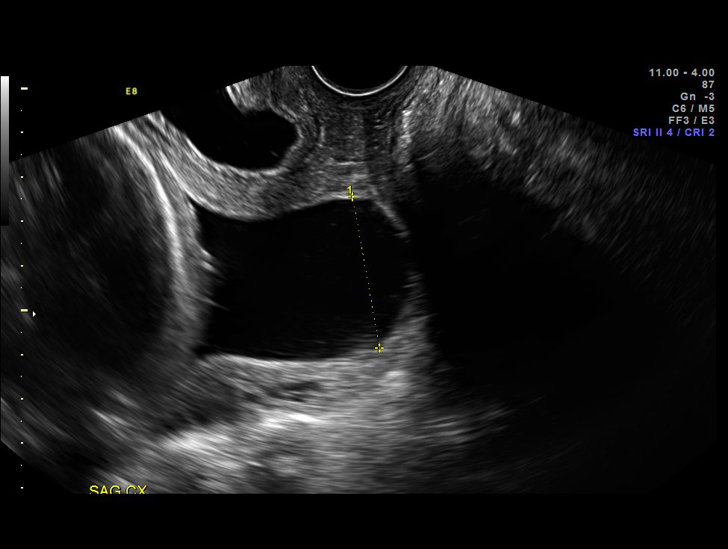
[im 29/31]
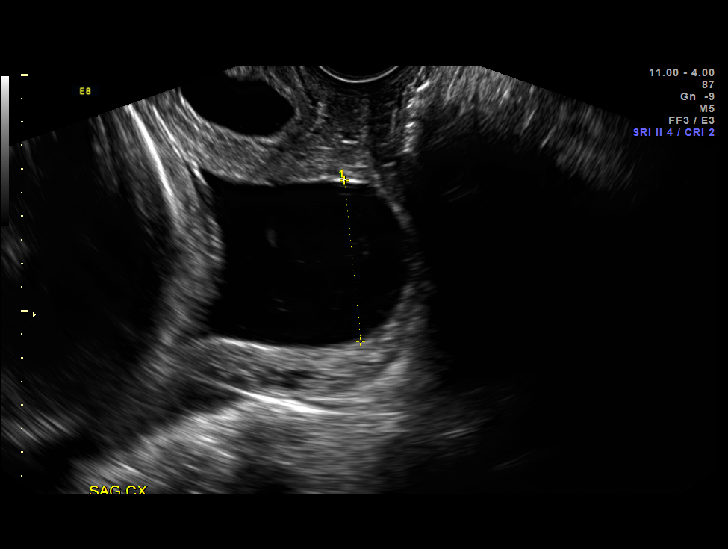

[13 of 28 positions shown; findings below may reference images not displayed]

OBSTETRICS REPORT
                      (Signed Final 06/12/2013 [DATE])

Service(s) Provided

 [HOSPITAL]                                         76815.0
 US MFM OB TRANSVAGINAL                                76817.2
Indications

 Premature cervical dilatation
 Cervical insufficiency
 Poor obstetric history: Previous midtrimester loss
 (22 weeks)
Fetal Evaluation

 Num Of Fetuses:    1
 Fetal Heart Rate:  134                          bpm
 Cardiac Activity:  Observed
 Presentation:      Cephalic
 Placenta:          Posterior Fundal, above
                    cervical os
 P. Cord            Visualized, central
 Insertion:

 Amniotic Fluid
 AFI FV:      Subjectively within normal limits
 AFI Sum:     16.98   cm       62  %Tile     Larg Pckt:    6.24  cm
 RUQ:   6.24    cm   RLQ:    1.86   cm    LUQ:   4.88    cm   LLQ:    4      cm
Gestational Age

 LMP:           37w 6d        Date:  09/20/12                 EDD:   06/27/13
 Best:          32w 3d     Det. By:  Early Ultrasound         EDD:   08/04/13
                                     (12/23/12)
Cervix Uterus Adnexa

 Cervix:       Appears dilated, see comments.

 Left Ovary:    Within normal limits.
 Right Ovary:   Within normal limits.
 Adnexa:     No abnormality visualized.
Impression

 Single IUP at 32 [DATE] weeks
 Cervical dilation s/p course of Defelice
 Limited ultrasound performed for cervical length
 No measurable cervical length - U-shaped funneling to the
 external os
 The cervix appears open
Recommendations

 Follow-up ultrasounds as clinically indicated.

 questions or concerns.

## 2014-11-09 ENCOUNTER — Encounter (HOSPITAL_COMMUNITY): Payer: Self-pay | Admitting: *Deleted

## 2014-11-09 ENCOUNTER — Emergency Department (HOSPITAL_COMMUNITY)
Admission: EM | Admit: 2014-11-09 | Discharge: 2014-11-09 | Disposition: A | Discharge status: Home / Self Care | Payer: BLUE CROSS/BLUE SHIELD | Attending: Emergency Medicine | Admitting: Emergency Medicine

## 2014-11-09 DIAGNOSIS — Z87891 Personal history of nicotine dependence: Secondary | ICD-10-CM | POA: Insufficient documentation

## 2014-11-09 DIAGNOSIS — J029 Acute pharyngitis, unspecified: Secondary | ICD-10-CM

## 2014-11-09 DIAGNOSIS — Z79899 Other long term (current) drug therapy: Secondary | ICD-10-CM | POA: Insufficient documentation

## 2014-11-09 DIAGNOSIS — Z7952 Long term (current) use of systemic steroids: Secondary | ICD-10-CM | POA: Insufficient documentation

## 2014-11-09 LAB — RAPID STREP SCREEN (MED CTR MEBANE ONLY): STREPTOCOCCUS, GROUP A SCREEN (DIRECT): NEGATIVE

## 2014-11-09 MED ORDER — PENICILLIN V POTASSIUM 500 MG PO TABS: 500.0000 mg | ORAL_TABLET | Freq: Four times a day (QID) | ORAL | Status: DC

## 2014-11-09 MED ORDER — IBUPROFEN 800 MG PO TABS: 800.0000 mg | ORAL_TABLET | Freq: Three times a day (TID) | ORAL | Status: DC

## 2014-11-09 NOTE — ED Provider Notes (Addendum)
CSN: [id number]     Arrival date & time 11/09/14  0631 History   First MD Initiated Contact with Patient 11/09/14 0700     Chief Complaint  Patient presents with  . Sore Throat      HPI  Patient presents for evaluation of a sore throat. No fevers and chills at home. Recheck temperature here 38 1. Pain with swallowing. No cough. Mild body aches and mild headache, pain in the anterior neck.  Past Medical History  Diagnosis Date  . Pregnant   . Pregnant 12/30/2012   Past Surgical History  Procedure Laterality Date  . No past surgeries     Family History  Problem Relation Age of Onset  . Hypertension Father    History  Substance Use Topics  . Smoking status: Former Smoker    Types: Cigarettes  . Smokeless tobacco: Never Used  . Alcohol Use: Yes   OB History    Gravida Para Term Preterm AB TAB SAB Ectopic Multiple Living       Review of Systems  Constitutional: Positive for fever. Negative for chills, diaphoresis, appetite change and fatigue.  HENT: Positive for sore throat. Negative for mouth sores and trouble swallowing.   Eyes: Negative for visual disturbance.  Respiratory: Negative for cough, chest tightness, shortness of breath and wheezing.   Cardiovascular: Negative for chest pain.  Gastrointestinal: Negative for nausea, vomiting, abdominal pain, diarrhea and abdominal distention.  Endocrine: Negative for polydipsia, polyphagia and polyuria.  Genitourinary: Negative for dysuria, frequency and hematuria.  Musculoskeletal: Negative for gait problem.  Skin: Negative for color change, pallor and rash.  Neurological: Positive for headaches. Negative for dizziness, syncope and light-headedness.  Hematological: Does not bruise/bleed easily.  Psychiatric/Behavioral: Negative for behavioral problems and confusion.      Allergies  Tomato  Home Medications   Prior to Admission medications   Medication Sig Start Date End Date Taking? Authorizing  Provider  ibuprofen (ADVIL,MOTRIN) 600 MG tablet Take 1 tablet (600 mg total) by mouth every 6 (six) hours as needed for moderate pain or cramping. 07/10/13   Dorathy Kinsman, CNM  ibuprofen (ADVIL,MOTRIN) 800 MG tablet Take 1 tablet (800 mg total) by mouth 3 (three) times daily. 11/09/14   Rolland Porter, MD  penicillin v potassium (VEETID) 500 MG tablet Take 1 tablet (500 mg total) by mouth 4 (four) times daily. 11/09/14   Rolland Porter, MD  predniSONE (DELTASONE) 10 MG tablet Take 2 tablets (20 mg total) by mouth 2 (two) times daily. 09/02/13   Geoffery Lyons, MD  Prenatal Vit-Fe Fumarate-FA (PRENATAL MULTIVITAMIN) TABS tablet Take 1 tablet by mouth daily at 12 noon.    Historical Provider, MD  SPRINTEC 28 0.25-35 MG-MCG tablet TAKE ONE TABLET BY MOUTH ONCE DAILY 09/08/14   Jacklyn Shell, CNM   BP 114/69 mmHg  Pulse 91  Temp(Src) 98.8 F (37.1 C) (Oral)  Resp 16  Ht  (1.575 m)  Wt 140 lb (63.504 kg)  BMI 25.60 kg/m2  SpO2 98%  LMP 10/25/2014 Physical Exam  Constitutional: She is oriented to person, place, and time. She appears well-developed and well-nourished. No distress.  HENT:  Head: Normocephalic.  Mouth/Throat:    Eyes: Conjunctivae are normal. Pupils are equal, round, and reactive to light. No scleral icterus.  Neck: Normal range of motion. Neck supple. No thyromegaly present.  Cardiovascular: Normal rate and regular rhythm.  Exam reveals no gallop and no friction rub.  No murmur heard. Pulmonary/Chest: Effort normal and breath sounds normal. No respiratory distress. She has no wheezes. She has no rales.  Abdominal: Soft. Bowel sounds are normal. She exhibits no distension. There is no tenderness. There is no rebound.  Musculoskeletal: Normal range of motion.  Neurological: She is alert and oriented to person, place, and time.  Skin: Skin is warm and dry. No rash noted.  Psychiatric: She has a normal mood and affect. Her behavior is normal.    ED Course  Procedures  (including critical care time) Labs Review Labs Reviewed  RAPID STREP SCREEN (NOT AT [facility])  CULTURE, GROUP A STREP    Imaging Review No results found.   EKG Interpretation None      MDM   Final diagnoses:  Pharyngitis    Recheck temp 38.1. Centor score of 4. Plan is treatment with penicillin.  Ibuprofen for pain.    Rolland Porter, MD 11/09/14 1610  Rolland Porter, MD 11/09/14 1210

## 2014-11-09 NOTE — Discharge Instructions (Signed)

## 2014-11-09 NOTE — ED Notes (Signed)
Patient with no complaints at this time. Respirations even and unlabored. Skin warm/dry. Discharge instructions reviewed with patient at this time. Patient given opportunity to voice concerns/ask questions. Patient discharged at this time and left Emergency Department with steady gait.   

## 2014-11-09 NOTE — ED Notes (Signed)
Pt c/o sore throat that started yesterday with chills,

## 2014-11-12 LAB — CULTURE, GROUP A STREP

## 2016-03-09 ENCOUNTER — Other Ambulatory Visit (HOSPITAL_COMMUNITY)
Admission: RE | Admit: 2016-03-09 | Discharge: 2016-03-09 | Disposition: A | Discharge status: Home / Self Care | Payer: BLUE CROSS/BLUE SHIELD | Source: Ambulatory Visit | Attending: Obstetrics and Gynecology | Admitting: Obstetrics and Gynecology

## 2016-03-09 ENCOUNTER — Encounter: Payer: Self-pay | Admitting: Obstetrics and Gynecology

## 2016-03-09 ENCOUNTER — Ambulatory Visit (INDEPENDENT_AMBULATORY_CARE_PROVIDER_SITE_OTHER): Payer: Self-pay | Admitting: Obstetrics and Gynecology

## 2016-03-09 VITALS — BP 104/68 | HR 79 | Ht 63.0 in | Wt 174.8 lb

## 2016-03-09 DIAGNOSIS — Z01419 Encounter for gynecological examination (general) (routine) without abnormal findings: Secondary | ICD-10-CM

## 2016-03-09 DIAGNOSIS — N970 Female infertility associated with anovulation: Secondary | ICD-10-CM

## 2016-03-09 DIAGNOSIS — Z113 Encounter for screening for infections with a predominantly sexual mode of transmission: Secondary | ICD-10-CM | POA: Insufficient documentation

## 2016-03-09 DIAGNOSIS — Z01411 Encounter for gynecological examination (general) (routine) with abnormal findings: Secondary | ICD-10-CM | POA: Insufficient documentation

## 2016-03-09 MED ORDER — CLOMIPHENE CITRATE 50 MG PO TABS: 50.0000 mg | ORAL_TABLET | Freq: Every day | ORAL | 2 refills | Status: DC

## 2016-03-09 NOTE — Progress Notes (Signed)
  Assessment:  Annual Gyn Exam History of anovulation, conceiving on Clomid 50 mg History of short cervix in 2nd trimester on bedrest in hosp x 30 d Desires pregnancy   Plan:  1. pap smear done, next pap due 3 years  2. Start Clomid tomorrow, return in 18 days for blood testing progesterone level  Subjective:  Tonya Burns is a 25 y.o. female G2P0201 who presents for annual exam. Patient's last menstrual period was 02/02/2016. The patient has no complaints today. She states she has stopped taking her OCP 3-4 months ago and is desiring pregnancy. She states her periods are irregular; LNMP ended Oct 4th.   The following portions of the patient's history were reviewed and updated as appropriate: allergies, current medications, past family history, past medical history, past social history, past surgical history and problem list. Past Medical History:  Diagnosis Date  . Pregnant   . Pregnant 12/30/2012    Past Surgical History:  Procedure Laterality Date  . NO PAST SURGERIES      No current outpatient prescriptions on file.  Review of Systems Constitutional: negative Gastrointestinal: negative Genitourinary: negative  Objective:  BP 104/68   Pulse 79   Ht 5\' 3"  (1.6 m)   Wt 174 lb 12.8 oz (79.3 kg)   LMP 02/02/2016   BMI 30.96 kg/m    BMI: Body mass index is 30.96 kg/m.  General Appearance: Alert, appropriate appearance for age. No acute distress HEENT: Grossly normal Neck / Thyroid:  Cardiovascular: RRR; normal S1, S2, no murmur Lungs: CTA bilaterally Back: No CVAT Breast Exam: No dimpling, nipple retraction or discharge. No masses or nodes., Normal to inspection, Normal breast tissue bilaterally and No masses or nodes.No dimpling, nipple retraction or discharge. Gastrointestinal: Soft, non-tender, no masses or organomegaly Pelvic Exam: External genitalia: normal general appearance Vaginal: normal mucosa without prolapse or lesions Cervix: normal appearance Adnexa:  normal bimanual exam Uterus: normal single, nontender, anterior Rectovaginal: not indicated Lymphatic Exam: Non-palpable nodes in neck, clavicular, axillary, or inguinal regions  Skin: no rash or abnormalities Neurologic: Normal gait and speech, no tremor  Psychiatric: Alert and oriented, appropriate affect.  Urinalysis:Not done  Christin BachJohn Yudith Norlander. MD Pgr [phone] 4:39 PM  By signing my name below, I, Sonum Patel, attest that this documentation has been prepared under the direction and in the presence of Tilda BurrowJohn V Jilene Spohr, MD. Electronically Signed: Sonum Patel, Neurosurgeoncribe. 03/09/16. 4:39 PM.  I personally performed the services described in this documentation, which was SCRIBED in my presence. The recorded information has been reviewed and considered accurate. It has been edited as necessary during review. Tilda BurrowFERGUSON,Juan Kissoon V, MD

## 2016-03-14 LAB — CYTOLOGY - PAP
Chlamydia: NEGATIVE
Neisseria Gonorrhea: NEGATIVE

## 2016-03-15 ENCOUNTER — Telehealth: Payer: Self-pay | Admitting: *Deleted

## 2016-03-15 NOTE — Telephone Encounter (Signed)
Pt informed of abnormal pap (LSIL) from 03/09/2016. Colposcopy scheduled with Dr.Ferguson on 03/29/2016. Procedure and abnormal pap results discussed and all questions answered with pt verbalizing understanding.

## 2016-03-20 NOTE — Progress Notes (Signed)
Put in recall

## 2016-03-20 NOTE — Progress Notes (Signed)
Pt has a follow up appointment.

## 2016-03-29 ENCOUNTER — Encounter: Payer: Self-pay | Admitting: Obstetrics and Gynecology

## 2016-03-29 ENCOUNTER — Ambulatory Visit: Payer: Self-pay | Admitting: Obstetrics and Gynecology

## 2016-04-19 ENCOUNTER — Encounter: Payer: Self-pay | Admitting: Obstetrics and Gynecology

## 2016-05-03 ENCOUNTER — Encounter: Payer: Self-pay | Admitting: Obstetrics and Gynecology

## 2016-05-10 ENCOUNTER — Encounter: Payer: Self-pay | Admitting: Obstetrics and Gynecology

## 2016-05-30 ENCOUNTER — Telehealth: Payer: Self-pay | Admitting: Obstetrics and Gynecology

## 2016-05-30 NOTE — Telephone Encounter (Signed)
Pt called stating that she would like to speak with Dr. Rayna SextonFerguson's nurse regarding a medication he has placed her on. Please contact pt

## 2016-05-30 NOTE — Telephone Encounter (Signed)
LMOVM for patient to return call.

## 2016-09-28 ENCOUNTER — Ambulatory Visit: Payer: Self-pay | Admitting: Adult Health

## 2016-10-16 ENCOUNTER — Encounter: Payer: Self-pay | Admitting: Obstetrics & Gynecology

## 2016-10-16 ENCOUNTER — Ambulatory Visit (INDEPENDENT_AMBULATORY_CARE_PROVIDER_SITE_OTHER): Payer: 59 | Admitting: Obstetrics & Gynecology

## 2016-10-16 VITALS — BP 100/70 | HR 76 | Ht 62.0 in | Wt 191.5 lb

## 2016-10-16 DIAGNOSIS — N970 Female infertility associated with anovulation: Secondary | ICD-10-CM | POA: Diagnosis not present

## 2016-10-16 DIAGNOSIS — Z3202 Encounter for pregnancy test, result negative: Secondary | ICD-10-CM

## 2016-10-16 LAB — POCT URINE PREGNANCY: Preg Test, Ur: NEGATIVE

## 2016-10-16 MED ORDER — CLOMIPHENE CITRATE 50 MG PO TABS: 100.0000 mg | ORAL_TABLET | Freq: Every day | ORAL | 5 refills | Status: DC

## 2016-10-16 MED ORDER — DESOGESTREL-ETHINYL ESTRADIOL 0.15-30 MG-MCG PO TABS: 1.0000 | ORAL_TABLET | Freq: Every day | ORAL | 11 refills | Status: DC

## 2016-10-16 NOTE — Progress Notes (Signed)
Chief Complaint  Patient presents with  . having irregular periods    Blood pressure 100/70, pulse 76, height 5\' 2"  (1.575 m), weight 191 lb 8 oz (86.9 kg), last menstrual period 09/08/2016, not currently breastfeeding.  26 y.o. [id number] Patient's last menstrual period was 09/08/2016 (exact date). The current method of family planning is none.  Outpatient Encounter Prescriptions as of 10/16/2016  Medication Sig  . clomiPHENE (CLOMID) 50 MG tablet Take 2 tablets (100 mg total) by mouth daily. Starting the first day on menses  . desogestrel-ethinyl estradiol (APRI,EMOQUETTE,SOLIA) 0.15-30 MG-MCG tablet Take 1 tablet by mouth daily.  . [DISCONTINUED] clomiPHENE (CLOMID) 50 MG tablet Take 1 tablet (50 mg total) by mouth daily.   No facility-administered encounter medications on file as of 10/16/2016.     Subjective Patient time required Clomid to conceive with her child and was ready to conceive again and was restarted on Clomid in November 30 of last year Whenever his not produce regular periods for her 3 months of taking it As a result she'll need a higher dose will go to 100 mg Her last cycle with all messed up so that I put her on a monthly birth control pills to stabilize and denies her endometrium When she begins bleeding she will take the Clomid for 5 days 100 mg if she doesn't start.  Within 30 today she is to do a pregnancy test and if it is negative she'll call me If she does have regular periods on the 100 mg she'll do that for 6 months or until she gets pregnant  Objective   Pertinent ROS   Labs or studies     Impression Diagnoses this Encounter::   ICD-10-CM   1. Anovulation N97.0   2. Pregnancy examination or test, negative result Z32.02 POCT urine pregnancy    Established relevant diagnosis(es):   Plan/Recommendations: Meds ordered this encounter  Medications  . desogestrel-ethinyl estradiol (APRI,EMOQUETTE,SOLIA) 0.15-30 MG-MCG tablet    Sig: Take  1 tablet by mouth daily.    Dispense:  1 Package    Refill:  11  . clomiPHENE (CLOMID) 50 MG tablet    Sig: Take 2 tablets (100 mg total) by mouth daily. Starting the first day on menses    Dispense:  10 tablet    Refill:  5    Labs or Scans Ordered: Orders Placed This Encounter  Procedures  . POCT urine pregnancy    Management:: As above 1 month of birth control pills to synchronize endometrium then start Clomid 100 mg for 5 days on the day she starts bleeding Call me as directed  Follow up Return for Follow up, with Dr Despina HiddenEure.        Face to face time:  15 minutes  Greater than 50% of the visit time was spent in counseling and coordination of care with the patient.  The summary and outline of the counseling and care coordination is summarized in the note above.   All questions were answered.  Past Medical History:  Diagnosis Date  . Pregnant   . Pregnant 12/30/2012    Past Surgical History:  Procedure Laterality Date  . NO PAST SURGERIES      OB History    Gravida Para Term Preterm AB Living   2 2 0 2 0 1   SAB TAB Ectopic Multiple Live Births   0 0 0 0 2      Allergies  Allergen Reactions  . Tomato Hives  Social History   Social History  . Marital status: Single    Spouse name: N/A  . Number of children: N/A  . Years of education: N/A   Social History Main Topics  . Smoking status: Former Smoker    Types: Cigarettes  . Smokeless tobacco: Never Used  . Alcohol use No  . Drug use: No  . Sexual activity: Yes    Birth control/ protection: None   Other Topics Concern  . None   Social History Narrative  . None    Family History  Problem Relation Age of Onset  . Hypertension Father   . Preterm labor Son

## 2018-02-03 ENCOUNTER — Emergency Department (HOSPITAL_COMMUNITY)
Admission: EM | Admit: 2018-02-03 | Discharge: 2018-02-03 | Disposition: A | Discharge status: Home / Self Care | Payer: Commercial Managed Care - PPO | Attending: Emergency Medicine | Admitting: Emergency Medicine

## 2018-02-03 ENCOUNTER — Encounter (HOSPITAL_COMMUNITY): Payer: Self-pay | Admitting: Emergency Medicine

## 2018-02-03 DIAGNOSIS — Z87891 Personal history of nicotine dependence: Secondary | ICD-10-CM | POA: Insufficient documentation

## 2018-02-03 DIAGNOSIS — M5441 Lumbago with sciatica, right side: Secondary | ICD-10-CM | POA: Insufficient documentation

## 2018-02-03 DIAGNOSIS — M545 Low back pain: Secondary | ICD-10-CM | POA: Diagnosis present

## 2018-02-03 DIAGNOSIS — Z79899 Other long term (current) drug therapy: Secondary | ICD-10-CM | POA: Insufficient documentation

## 2018-02-03 MED ORDER — PREDNISONE 10 MG (21) PO TBPK: ORAL_TABLET | Freq: Every day | ORAL | 0 refills | Status: DC

## 2018-02-03 MED ORDER — CYCLOBENZAPRINE HCL 10 MG PO TABS: 10.0000 mg | ORAL_TABLET | Freq: Two times a day (BID) | ORAL | 0 refills | Status: DC | PRN

## 2018-02-03 NOTE — ED Provider Notes (Signed)
[facility] EMERGENCY DEPARTMENT Provider Note   CSN: [id number] Arrival date & time: 02/03/18  1033     History   Chief Complaint Chief Complaint  Patient presents with  . Back Pain    HPI Tonya Burns is a 27 y.o. female presents today for evaluation of acute onset, constant right-sided low back pain for 4 days.  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  She states that pain is sharp, radiates down the right lower extremity occasionally.  Pain worsens with prolonged positioning, bending, and attempts to ambulate.  Denies numbness, weakness, fevers, IV drug use, cancer, bowel or bladder incontinence, or saddle paresthesias.  She has been taking ibuprofen and applying IcyHot which was initially helpful but has had no relief with this today.  The history is provided by the patient.    Past Medical History:  Diagnosis Date  . Pregnant   . Pregnant 12/30/2012    Patient Active Problem List   Diagnosis Date Noted  . Active preterm labor 07/09/2013  . Vacuum extractor delivery, delivered 07/09/2013  . Pregnant 07/02/2013  . 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 Premature cervical dilation in third trimester 06/04/2013  . Short cervix in second trimester, antepartum 05/06/2013  . Infertility associated with anovulation 10/31/2012  . History of preterm delivery, currently pregnant in third trimester 04/27/2012    Past Surgical History:  Procedure Laterality Date  . NO PAST SURGERIES       OB History    Gravida  2   Para  2   Term  0   Preterm  2   AB  0   Living  1     SAB  0   TAB  0   Ectopic  0   Multiple  0   Live Births  2            Home Medications    Prior to Admission medications   Medication Sig Start Date End Date Taking? Authorizing Provider  clomiPHENE (CLOMID) 50 MG tablet Take 2 tablets (100 mg total) by mouth daily. Starting the first day on menses 10/16/16   Lazaro Arms, MD    cyclobenzaprine (FLEXERIL) 10 MG tablet Take 1 tablet (10 mg total) by mouth 2 (two) times daily as needed for muscle spasms. 02/03/18   Kolbee Bogusz A, PA-C  desogestrel-ethinyl estradiol (APRI,EMOQUETTE,SOLIA) 0.15-30 MG-MCG tablet Take 1 tablet by mouth daily. 10/16/16   Lazaro Arms, MD  predniSONE (STERAPRED UNI-PAK 21 TAB) 10 MG (21) TBPK tablet Take by mouth daily. Take 6 tabs by mouth daily  for 1 days, then 5 tabs for 1 days, then 4 tabs for 1 days, then 3 tabs for 1 days, 2 tabs for 1 days, then 1 tab by mouth daily for 1 days 02/03/18   Jeanie Sewer, PA-C    Family History Family History  Problem Relation Age of Onset  . Hypertension Father   . Preterm labor Son     Social History Social History   Tobacco Use  . Smoking status: Former Smoker    Types: Cigarettes  . Smokeless tobacco: Never Used  Substance Use Topics  . Alcohol use: No  . Drug use: No     Allergies   Tomato   Review of Systems Review of Systems  Constitutional: Negative for fever.  Musculoskeletal: Positive for back pain.  Neurological: Negative for weakness and numbness.  Physical Exam Updated Vital Signs BP 112/67 (BP Location: Right Arm)   Pulse 72   Temp 98.6 F (37 C) (Oral)   Resp 16   Ht 5\' 2"  (1.575 m)   Wt 81.6 kg   LMP 12/12/2017   SpO2 100%   BMI 32.92 kg/m   Physical Exam  Constitutional: She appears well-developed and well-nourished. No distress.  HENT:  Head: Normocephalic and atraumatic.  Eyes: Conjunctivae are normal. Right eye exhibits no discharge. Left eye exhibits no discharge.  Neck: No JVD present. No tracheal deviation present.  Cardiovascular: Normal rate.  Pulmonary/Chest: Effort normal.  Abdominal: She exhibits no distension.  Musculoskeletal: She exhibits no edema.  No midline lumbar spine tenderness, right paralumbar muscle tenderness and right SI joint tenderness noted.  5/5 strength of BLE major muscle groups with significant pain in the lumbar  region with flexion of the right hips.  Decreased range of motion with flexion and extension of the lumbar spine secondary to pain, no limitation with lateral rotation.  Neurological: She is alert.  Fluent speech, no facial droop, sensation intact to soft touch of bilateral lower extremities, ambulates with antalgic gait but exhibits good balance and is able to Heel Walk and Toe Walk without difficulty  Skin: Skin is warm and dry. No erythema.  Psychiatric: She has a normal mood and affect. Her behavior is normal.  Nursing note and vitals reviewed.    ED Treatments / Results  Labs (all labs ordered are listed, but only abnormal results are displayed) Labs Reviewed - No data to display  EKG None  Radiology No results found.  Procedures Procedures (including critical care time)  Medications Ordered in ED Medications - No data to display   Initial Impression / Assessment and Plan / ED Course  I have reviewed the triage vital signs and the nursing notes.  Pertinent labs & imaging results that were available during my care of the patient were reviewed by me and considered in my medical decision making (see chart for details).     Patient with back pain after lifting a patient at work.  Patient is afebrile, vital signs are stable.  She is nontoxic in appearance.  No neurological deficits and normal neuro exam.  Patient can walk but states is painful.  No loss of bowel or bladder control.  No concern for cauda equina.  No fever, night sweats, weight loss, h/o cancer, IVDU.  No concern for dissection. Conservative management indicated and discussed with patient.  Will discharge with steroid taper and Flexeril, advised of appropriate use of these medications.  Recommend follow-up with PCP if symptoms persist.  Discussed strict ED return precautions. Pt verbalized understanding of and agreement with plan and is safe for discharge home at this time.    Final Clinical Impressions(s) / ED  Diagnoses   Final diagnoses:  Acute right-sided low back pain with right-sided sciatica    ED Discharge Orders         Ordered    predniSONE (STERAPRED UNI-PAK 21 TAB) 10 MG (21) TBPK tablet  Daily     02/03/18 1224    cyclobenzaprine (FLEXERIL) 10 MG tablet  2 times daily PRN     02/03/18 1224           Zaylan Kissoon, Marlynn Perking, PA-C 02/03/18 1225    Melene Plan, DO 02/03/18 1236

## 2018-02-03 NOTE — Discharge Instructions (Signed)
1. Medications: Take steroid taper as prescribed with food to avoid upset stomach issues.  Do not take ibuprofen, Advil, Aleve, or Motrin while taking this medicine.  You may take [phone] mg of Tylenol every 6 hours as needed for pain. Do not exceed 4000 mg of Tylenol daily.  You can take Flexeril as needed for muscle relaxation but this medication may make you drowsy so do not drive, drink alcohol, operate heavy machinery, or make important decisions while you are using this medicine.  I typically recommend only taking this medicine at night.  You can also cut these tablets in half if they are very strong. 2. Treatment: rest, ice, elevate and use brace, drink plenty of fluids, gentle stretching (see attached exercises, but you can also YouTube search low back and hip physical therapy exercises).   3. Follow Up: Please followup with orthopedics as directed or your PCP in 1 week if no improvement for discussion of your diagnoses and further evaluation after today's visit; if you do not have a primary care doctor use the resource guide provided to find one; Please return to the ER for worsening symptoms or other concerns such as worsening swelling, redness of the skin, fevers, loss of pulses, or loss of feeling

## 2018-02-03 NOTE — ED Triage Notes (Signed)
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 States pain with ambulation. Denies numbness/weakness.

## 2018-09-23 ENCOUNTER — Telehealth: Payer: Self-pay | Admitting: Obstetrics and Gynecology

## 2018-09-23 NOTE — Telephone Encounter (Signed)
Pt states that she has been on her menstrual cycle for 3 weeks and is requesting to be seen or to see what can be done to stop her menstrual cycle.

## 2018-10-08 ENCOUNTER — Ambulatory Visit: Payer: Commercial Managed Care - PPO | Admitting: Women's Health

## 2018-10-09 ENCOUNTER — Other Ambulatory Visit: Payer: Self-pay

## 2018-10-09 ENCOUNTER — Ambulatory Visit (INDEPENDENT_AMBULATORY_CARE_PROVIDER_SITE_OTHER): Payer: Commercial Managed Care - PPO | Admitting: Advanced Practice Midwife

## 2018-10-09 ENCOUNTER — Encounter: Payer: Self-pay | Admitting: Advanced Practice Midwife

## 2018-10-09 VITALS — BP 110/73 | HR 95 | Ht 62.0 in | Wt 197.0 lb

## 2018-10-09 DIAGNOSIS — N938 Other specified abnormal uterine and vaginal bleeding: Secondary | ICD-10-CM | POA: Diagnosis not present

## 2018-10-09 DIAGNOSIS — D649 Anemia, unspecified: Secondary | ICD-10-CM | POA: Diagnosis not present

## 2018-10-09 LAB — POCT HEMOGLOBIN: Hemoglobin: 9.3 g/dL — AB (ref 11–14.6)

## 2018-10-09 MED ORDER — FERROUS SULFATE 325 (65 FE) MG PO TABS: 325.0000 mg | ORAL_TABLET | Freq: Two times a day (BID) | ORAL | 3 refills | Status: DC

## 2018-10-09 MED ORDER — MEGESTROL ACETATE 40 MG PO TABS: ORAL_TABLET | ORAL | 3 refills | Status: DC

## 2018-10-09 NOTE — Progress Notes (Signed)
[facility] Visit  Patient name: Tonya Burns MRN [id number]  Date of birth: November 22, 1990  CC & HPI:  Tonya Burns is a 28 y.o.  female presenting today for prolonged bleeding. PEriods had been fairly regular until May.  In May she had a period that lasted 3 weeks, went off for one week, and has been bleeding every since. Varies from heavy to light, mostly heavy. No extra cramps/pain .Not sexually active for 2 years and not on any medicines.   Pertinent History Reviewed:  Medical & Surgical Hx:   Past Medical History:  Diagnosis Date  . Pregnant   . Pregnant 12/30/2012   Past Surgical History:  Procedure Laterality Date  . NO PAST SURGERIES     Family History  Problem Relation Age of Onset  . Hypertension Father   . Preterm labor Son     Current Outpatient Medications:  .  ferrous sulfate 325 (65 FE) MG tablet, Take 1 tablet (325 mg total) by mouth 2 (two) times daily with a meal., Disp: 60 tablet, Rfl: 3 .  megestrol (MEGACE) 40 MG tablet, Take 3/day (at the same time) for 5 days; 2/day for 5 days, then 1/day PO prn bleeding, Disp: 60 tablet, Rfl: 3 Social History: Reviewed -  reports that she has quit smoking. Her smoking use included cigarettes. She has never used smokeless tobacco.  Review of Systems:   Constitutional: Negative for fever and chills Eyes: Negative for visual disturbances Respiratory: Negative for shortness of breath, dyspnea Cardiovascular: Negative for chest pain or palpitations  Gastrointestinal: Negative for vomiting, diarrhea and constipation; no abdominal pain Genitourinary: Negative for dysuria and urgency, vaginal irritation or itching Musculoskeletal: Negative for back pain, joint pain, myalgias  Neurological: Negative for dizziness and headaches    Objective Findings:    Physical Examination: Vitals:   10/09/18 1525  BP: 110/73  Pulse: 95   General appearance - well appearing, and in no distress Mental status - alert,  oriented to person, place, and time Chest:  Normal respiratory effort Heart - normal rate and regular rhythm Abdomen:  Soft, nontender Pelvic: SSE:  Moderate amount of dark menstrual blood.  Cx non friable.  Musculoskeletal:  Normal range of motion without pain Extremities:  No edema    Results for orders placed or performed in visit on 10/09/18 (from the past 24 hour(s))  POCT hemoglobin   Collection Time: 10/09/18  4:36 PM  Result Value Ref Range   Hemoglobin 9.3 (A) 11 - 14.6 g/dL      Assessment & Plan:  A:   DUB  anemia P:  Megace algorhithm, Feso4 BID  Pelvic US   Return for 1-2 weeks pelvic US and MD visit afterwards.  Christin Fudge CNM 10/09/2018 4:45 PM

## 2018-10-09 NOTE — Patient Instructions (Signed)
Abnormal Uterine Bleeding Abnormal uterine bleeding is unusual bleeding from the uterus. It includes:  Bleeding or spotting between periods.  Bleeding after sex.  Bleeding that is heavier than normal.  Periods that last longer than usual.  Bleeding after menopause. Abnormal uterine bleeding can affect women at various stages in life, including teenagers, women in their reproductive years, pregnant women, and women who have reached menopause. Common causes of abnormal uterine bleeding include:  Pregnancy.  Growths of tissue (polyps).  A noncancerous tumor in the uterus (fibroid).  Infection.  Cancer.  Hormonal imbalances. Any type of abnormal bleeding should be evaluated by a health care provider. Many cases are minor and simple to treat, while others are more serious. Treatment will depend on the cause of the bleeding. Follow these instructions at home:  Monitor your condition for any changes.  Do not use tampons, douche, or have sex if told by your health care provider.  Change your pads often.  Get regular exams that include pelvic exams and cervical cancer screening.  Keep all follow-up visits as told by your health care provider. This is important. Contact a health care provider if:  Your bleeding lasts for more than one week.  You feel dizzy at times.  You feel nauseous or you vomit. Get help right away if:  You pass out.  Your bleeding soaks through a pad every hour.  You have abdominal pain.  You have a fever.  You become sweaty or weak.  You pass large blood clots from your vagina. Summary  Abnormal uterine bleeding is unusual bleeding from the uterus.  Any type of abnormal bleeding should be evaluated by a health care provider. Many cases are minor and simple to treat, while others are more serious.  Treatment will depend on the cause of the bleeding. This information is not intended to replace advice given to you by your health care provider.  Make sure you discuss any questions you have with your health care provider. Document Released: 03/27/2005 Document Revised: 07/04/2017 Document Reviewed: 04/28/2016 Elsevier Patient Education  2020 Elsevier Inc.  

## 2018-10-11 ENCOUNTER — Emergency Department
Admission: EM | Admit: 2018-10-11 | Discharge: 2018-10-11 | Disposition: A | Discharge status: Home / Self Care | Payer: Commercial Managed Care - PPO | Attending: Emergency Medicine | Admitting: Emergency Medicine

## 2018-10-11 ENCOUNTER — Other Ambulatory Visit: Payer: Self-pay

## 2018-10-11 ENCOUNTER — Emergency Department: Payer: Commercial Managed Care - PPO

## 2018-10-11 DIAGNOSIS — Z87891 Personal history of nicotine dependence: Secondary | ICD-10-CM | POA: Diagnosis not present

## 2018-10-11 DIAGNOSIS — Y9389 Activity, other specified: Secondary | ICD-10-CM | POA: Diagnosis not present

## 2018-10-11 DIAGNOSIS — Y9289 Other specified places as the place of occurrence of the external cause: Secondary | ICD-10-CM | POA: Diagnosis not present

## 2018-10-11 DIAGNOSIS — M545 Low back pain, unspecified: Secondary | ICD-10-CM

## 2018-10-11 DIAGNOSIS — Y999 Unspecified external cause status: Secondary | ICD-10-CM | POA: Insufficient documentation

## 2018-10-11 DIAGNOSIS — M62830 Muscle spasm of back: Secondary | ICD-10-CM

## 2018-10-11 DIAGNOSIS — X509XXA Other and unspecified overexertion or strenuous movements or postures, initial encounter: Secondary | ICD-10-CM | POA: Insufficient documentation

## 2018-10-11 MED ORDER — KETOROLAC TROMETHAMINE 30 MG/ML IJ SOLN
30.0000 mg | Freq: Once | INTRAMUSCULAR | Status: AC
  Administered 2018-10-11: 30 mg via INTRAMUSCULAR
  Filled 2018-10-11: qty 1

## 2018-10-11 MED ORDER — OXYCODONE-ACETAMINOPHEN 5-325 MG PO TABS
1.0000 | ORAL_TABLET | Freq: Four times a day (QID) | ORAL | 0 refills | Status: DC | PRN
Start: 2018-10-11 — End: 2019-03-31

## 2018-10-11 MED ORDER — METHOCARBAMOL 500 MG PO TABS
1000.0000 mg | ORAL_TABLET | Freq: Once | ORAL | Status: AC
  Administered 2018-10-11: 11:00:00 1000 mg via ORAL
  Filled 2018-10-11: qty 2

## 2018-10-11 MED ORDER — OXYCODONE-ACETAMINOPHEN 5-325 MG PO TABS
1.0000 | ORAL_TABLET | Freq: Once | ORAL | Status: AC
  Administered 2018-10-11: 1 via ORAL
  Filled 2018-10-11: qty 1

## 2018-10-11 MED ORDER — METHOCARBAMOL 500 MG PO TABS: 500.0000 mg | ORAL_TABLET | Freq: Four times a day (QID) | ORAL | 0 refills | Status: DC | PRN

## 2018-10-11 NOTE — ED Notes (Signed)
AAOx3.  Skin warm and dry.  NAD 

## 2018-10-11 NOTE — ED Notes (Signed)
Pt states she has not had sex in 2 years and does not want pregnancy test - provider informed

## 2018-10-11 NOTE — ED Provider Notes (Signed)
[facility] Emergency Department Provider Note   ____________________________________________   First MD Initiated Contact with Patient 10/11/18 1040     (approximate)  I have reviewed the triage vital signs and the nursing notes.   HISTORY  Chief Complaint Back Pain  HPI Tonya Burns is a 28 y.o. female presents to the ED via EMS after she bent over at work and experienced back pain.  She states she has been seen in the past 6 months at [facility] for the same thing.  Patient states that she was not given any follow-up during that visit.  She denies any trauma to her back.  She states that she actually woke up this morning with some "small pain" which intensified when she was putting a tray down.  She denies any urinary symptoms, history of kidney stones, incontinence of bowel or bladder.  She denies any previous injury to her back.  Currently she rates her pain as a 10/10.       Past Medical History:  Diagnosis Date  . Pregnant   . Pregnant 12/30/2012    Patient Active Problem List   Diagnosis Date Noted  . Anemia 10/09/2018  . DUB (dysfunctional uterine bleeding) 10/09/2018  . 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 Infertility associated with anovulation 10/31/2012    Past Surgical History:  Procedure Laterality Date  . NO PAST SURGERIES      Prior to Admission medications   Medication Sig Start Date End Date Taking? Authorizing Provider  ferrous sulfate 325 (65 FE) MG tablet Take 1 tablet (325 mg total) by mouth 2 (two) times daily with a meal. 10/09/18   Cresenzo-Dishmon, Joaquim Lai, CNM  megestrol (MEGACE) 40 MG tablet Take 3/day (at the same time) for 5 days; 2/day for 5 days, then 1/day PO prn bleeding 10/09/18   Cresenzo-Dishmon, Joaquim Lai, CNM  methocarbamol (ROBAXIN) 500 MG tablet Take 1 tablet (500 mg total) by mouth every 6 (six) hours as needed. 10/11/18   Johnn Hai, PA-C  oxyCODONE-acetaminophen (PERCOCET) 5-325 MG tablet Take 1-2 tablets  by mouth every 6 (six) hours as needed for severe pain. 10/11/18   Johnn Hai, PA-C    Allergies Tomato  Family History  Problem Relation Age of Onset  . Hypertension Father   . Preterm labor Son     Social History Social History   Tobacco Use  . Smoking status: Former Smoker    Types: Cigarettes  . Smokeless tobacco: Never Used  Substance Use Topics  . Alcohol use: No  . Drug use: No    Review of Systems Constitutional: No fever/chills Eyes: No visual changes. ENT: No sore throat. Cardiovascular: Denies chest pain. Respiratory: Denies shortness of breath. Gastrointestinal: No abdominal pain.  No nausea, no vomiting.   Genitourinary: Negative for dysuria.  Positive for abnormal uterine bleeding recently seen. Musculoskeletal: Positive for low back pain. Skin: Negative for rash. Neurological: Negative for headaches, focal weakness or numbness. ___________________________________________   PHYSICAL EXAM:  VITAL SIGNS: ED Triage Vitals  Enc Vitals Group     BP 10/11/18 1040 132/70     Pulse Rate 10/11/18 1040 97     Resp 10/11/18 1040 20     Temp 10/11/18 1040 98 F (36.7 C)     Temp Source 10/11/18 1040 Oral     SpO2 10/11/18 1040 98 %     Weight 10/11/18 1041 196 lb (88.9 kg)     Height 10/11/18 1041 5\' 2"  (1.575 m)  Head Circumference --      Peak Flow --      Pain Score 10/11/18 1041 10     Pain Loc --      Pain Edu? --      Excl. in GC? --    Constitutional: Alert and oriented. Well appearing and in no acute distress. Eyes: Conjunctivae are normal.  Head: Atraumatic. Neck: No stridor.   Cardiovascular: Normal rate, regular rhythm. Grossly normal heart sounds.  Good peripheral circulation. Respiratory: Normal respiratory effort.  No retractions. Lungs CTAB. Gastrointestinal: Soft and nontender. No distention. Musculoskeletal: Patient is able move upper and lower extremities without any difficulty.  On examination of the back range of motion  is greatly limited to secondary to pain and also muscle spasms.  No point tenderness on palpation of the thoracic or lumbar spine.  Patient is more tender to the paravertebral muscles approximately L4-L5 area bilaterally.  No step-offs are noted. Neurologic:  Normal speech and language. No gross focal neurologic deficits are appreciated.  Skin:  Skin is warm, dry and intact. No rash noted. Psychiatric: Mood and affect are normal. Speech and behavior are normal.  ____________________________________________   LABS (all labs ordered are listed, but only abnormal results are displayed)  Labs Reviewed - No data to display  RADIOLOGY  ED MD interpretation:   Lumbar spine x-ray is negative for any acute changes.  Official radiology report(s): Dg Lumbar Spine 2-3 Views  Result Date: 10/11/2018 CLINICAL DATA:  Patient with burning back pain.  No known injury. EXAM: LUMBAR SPINE - 2-3 VIEW COMPARISON:  None. FINDINGS: Normal anatomic alignment. No evidence for acute fracture or dislocation. Preservation of the vertebral body and intervertebral disc space heights. SI joints are unremarkable. Unremarkable bowel gas pattern. IMPRESSION: No acute process. Electronically Signed   By: Annia Beltrew  Davis M.D.   On: 10/11/2018 12:14    ____________________________________________   PROCEDURES  Procedure(s) performed (including Critical Care):  Procedures   ____________________________________________   INITIAL IMPRESSION / ASSESSMENT AND PLAN / ED COURSE  As part of my medical decision making, I reviewed the following data within the electronic MEDICAL RECORD NUMBER Notes from prior ED visits and [place] Controlled Substance Database  28 year old female presents to the ED via EMS after she experienced increased low back pain when moving a tray while at work.  Patient states that this is not Workmen's Comp. and that she experienced back pain when she woke this morning.  She has had similar experiences in the  past with the last one being in October 2019.  Physical exam was consistent with acute muscle strain and patient was a comfortable.  Patient was given Percocet 5 mg along with methocarbamol 1000 mg p.o.  Patient was reassured that x-rays were negative for any acute changes.  She is instructed to follow-up with orthopedics if any continued problems.  She was discharged with a prescription for Percocet 1 or 2 as needed for pain every 6 hours.  Also methocarbamol as needed for muscle spasms.  She is encouraged to use ice or heat to her back as needed.  She is also given a note to remain out of work.   ____________________________________________   FINAL CLINICAL IMPRESSION(S) / ED DIAGNOSES  Final diagnoses:  Acute bilateral low back pain without sciatica  Muscle spasm of back     ED Discharge Orders         Ordered    oxyCODONE-acetaminophen (PERCOCET) 5-325 MG tablet  Every 6 hours PRN  10/11/18 1207    methocarbamol (ROBAXIN) 500 MG tablet  Every 6 hours PRN     10/11/18 1207           Note:  This document was prepared using Dragon voice recognition software and may include unintentional dictation errors.    Tommi RumpsSummers, Virgle Arth L, PA-C 10/11/18 1338    Phineas SemenGoodman, [place], MD 10/11/18 [phone]

## 2018-10-11 NOTE — ED Triage Notes (Signed)
Pt got up this am with a sharp pain in lower back - when she got to work and bent over her back "tightened up and started burning" and she has been in pain since - pain radiates down bilat legs - this happened before approx 6 months ago and pt was seen at [facility] with no f/u

## 2018-10-17 ENCOUNTER — Other Ambulatory Visit: Payer: Self-pay | Admitting: Advanced Practice Midwife

## 2018-10-17 DIAGNOSIS — N938 Other specified abnormal uterine and vaginal bleeding: Secondary | ICD-10-CM

## 2018-10-21 ENCOUNTER — Ambulatory Visit (INDEPENDENT_AMBULATORY_CARE_PROVIDER_SITE_OTHER): Payer: Commercial Managed Care - PPO | Admitting: Obstetrics and Gynecology

## 2018-10-21 ENCOUNTER — Ambulatory Visit (INDEPENDENT_AMBULATORY_CARE_PROVIDER_SITE_OTHER): Payer: Commercial Managed Care - PPO

## 2018-10-21 ENCOUNTER — Other Ambulatory Visit: Payer: Self-pay

## 2018-10-21 ENCOUNTER — Encounter: Payer: Self-pay | Admitting: Obstetrics and Gynecology

## 2018-10-21 VITALS — BP 105/68 | HR 81 | Ht 62.0 in | Wt 197.6 lb

## 2018-10-21 DIAGNOSIS — N938 Other specified abnormal uterine and vaginal bleeding: Secondary | ICD-10-CM

## 2018-10-21 MED ORDER — MEDROXYPROGESTERONE ACETATE 10 MG PO TABS: 10.0000 mg | ORAL_TABLET | Freq: Every day | ORAL | 3 refills | Status: DC

## 2018-10-21 NOTE — Progress Notes (Signed)
PELVIC US TA/TV: homogeneous anteverted uterus,wnl,mult simple nabothian cysts,normal ovaries bilat,ovaries appear mobile,EEC 11.24 mm,right adnexal pain during ultrasound,no free fluid

## 2018-10-21 NOTE — Progress Notes (Signed)
Patient ID: RICCA MELGAREJO, female   DOB: 1990-07-08, 28 y.o.   MRN: [id number]   [facility] Visit  @DATE @            Patient name: Tonya Burns MRN [id number]  Date of birth: 15-Oct-1990  CC & HPI:  Tonya Burns is a 28 y.o. female presenting today for u/s follow done today in office. AUB has ceased. Denies any pain/ cramping. Been 2 years since last sexual activity. No BC at present time.  PELVIC US TA/TV showed: homogeneous anteverted uterus,wnl,mult simple nabothian cysts,normal ovaries bilat,ovaries appear mobile,EEC 11.24 mm,right adnexal pain during ultrasound,no free fluid.  ROS:  ROS +thickened endometrium -AUB -cramping  Pertinent History Reviewed:   Reviewed:  Medical         Past Medical History:  Diagnosis Date  . Pregnant   . Pregnant 12/30/2012                              Surgical Hx:    Past Surgical History:  Procedure Laterality Date  . NO PAST SURGERIES     Medications: Reviewed & Updated - see associated section                       Current Outpatient Medications:  .  ferrous sulfate 325 (65 FE) MG tablet, Take 1 tablet (325 mg total) by mouth 2 (two) times daily with a meal., Disp: 60 tablet, Rfl: 3 .  megestrol (MEGACE) 40 MG tablet, Take 3/day (at the same time) for 5 days; 2/day for 5 days, then 1/day PO prn bleeding, Disp: 60 tablet, Rfl: 3 .  methocarbamol (ROBAXIN) 500 MG tablet, Take 1 tablet (500 mg total) by mouth every 6 (six) hours as needed., Disp: 15 tablet, Rfl: 0 .  oxyCODONE-acetaminophen (PERCOCET) 5-325 MG tablet, Take 1-2 tablets by mouth every 6 (six) hours as needed for severe pain., Disp: 20 tablet, Rfl: 0   Social History: Reviewed -  reports that she has quit smoking. Her smoking use included cigarettes. She has never used smokeless tobacco.  Objective Findings:  Vitals: Last menstrual period 10/11/2018.  PHYSICAL EXAMINATION General appearance - alert, well appearing, and in no distress Mental status - alert,  oriented to person, place, and time, normal mood, behavior, speech, dress, motor activity, and thought processes, affect appropriate to mood  PELVIC Uterus - thickened based off of u/s  Deferred discussion only    Assessment & Plan:   A:  1. AUB stabilized on Megace 2. Thickened endometrium  P:  1. Discontinue megace 2. Rx Provera x14 days 3. F/u 3 months  Review menses and review medication management   By signing my name below, I, Samul Dada, attest that this documentation has been prepared under the direction and in the presence of Jonnie Kind, MD. Electronically Signed: [place]. 10/21/18. 3:26 PM.  I personally performed the services described in this documentation, which was SCRIBED in my presence. The recorded information has been reviewed and considered accurate. It has been edited as necessary during review. Jonnie Kind, MD

## 2018-12-19 ENCOUNTER — Telehealth: Payer: Self-pay | Admitting: *Deleted

## 2018-12-19 NOTE — Telephone Encounter (Signed)
Patient states she is having a herpes outbreak and would like for Acyclovir to be sent in please.

## 2018-12-20 ENCOUNTER — Telehealth: Payer: Self-pay | Admitting: Obstetrics & Gynecology

## 2018-12-20 ENCOUNTER — Telehealth: Payer: Self-pay | Admitting: *Deleted

## 2018-12-20 MED ORDER — VALACYCLOVIR HCL 500 MG PO TABS: 500.0000 mg | ORAL_TABLET | Freq: Two times a day (BID) | ORAL | 11 refills | Status: DC

## 2018-12-20 NOTE — Telephone Encounter (Signed)
Called patient but unable to leave VM that medication was sent to pharmacy as requested.

## 2018-12-20 NOTE — Telephone Encounter (Signed)
done

## 2019-01-22 ENCOUNTER — Encounter: Payer: Self-pay | Admitting: Obstetrics and Gynecology

## 2019-01-22 ENCOUNTER — Telehealth: Payer: Self-pay | Admitting: Obstetrics and Gynecology

## 2019-01-22 ENCOUNTER — Telehealth (INDEPENDENT_AMBULATORY_CARE_PROVIDER_SITE_OTHER): Payer: Self-pay | Admitting: Obstetrics and Gynecology

## 2019-01-22 ENCOUNTER — Other Ambulatory Visit: Payer: Self-pay

## 2019-01-22 DIAGNOSIS — N938 Other specified abnormal uterine and vaginal bleeding: Secondary | ICD-10-CM

## 2019-01-22 DIAGNOSIS — Z8742 Personal history of other diseases of the female genital tract: Secondary | ICD-10-CM | POA: Insufficient documentation

## 2019-01-22 MED ORDER — MEDROXYPROGESTERONE ACETATE 10 MG PO TABS: 10.0000 mg | ORAL_TABLET | Freq: Every day | ORAL | 3 refills | Status: DC

## 2019-01-22 NOTE — Telephone Encounter (Signed)
Unable to reach patient by phone will await her return call

## 2019-01-22 NOTE — Progress Notes (Signed)
Patient ID: Tonya Burns, female   DOB: [birth year], 28 y.o.   MRN: [id number]    [place] VIRTUAL GYNECOLOGY VISIT ENCOUNTER NOTE  I connected with Harless Nakayama on 01/22/2019 at  8:50 AM EDT by telephone at home and verified that I am speaking with the correct person using two identifiers.   I discussed the limitations, risks, security and privacy concerns of performing an evaluation and management service by telephone and the availability of in person appointments. I also discussed with the patient that there may be a patient responsible charge related to this service. The patient expressed understanding and agreed to proceed.   History:  Tonya Burns is a 28 y.o. G53P0201 female being evaluated today for f/u of amenorrhea.  She had some irregular bleeding earlier this summer, had been given Megace to control the bleeding which worked.  She is then given Provera to cause withdrawal bleed but did not have any withdrawal.  It has now been 3 months of amenorrhea.  She She has a history of having irregular menses and going several months between periods.  She has no vasomotor symptoms or anything to suggest premature menopause  She denies any abnormal vaginal discharge, bleeding, pelvic pain or other concerns.       Past Medical History:  Diagnosis Date  . Pregnant   . Pregnant 12/30/2012   Past Surgical History:  Procedure Laterality Date  . NO PAST SURGERIES     The following portions of the patient's history were reviewed and updated as appropriate: allergies, current medications, past family history, past medical history, past social history, past surgical history and problem list.   Health Maintenance:  LSIL pap and negative HRHPV on 03/09/2016.  Review of Systems:  Pertinent items noted in HPI and remainder of comprehensive ROS otherwise negative.  Physical Exam:   General:  Alert, oriented and cooperative.   Mental Status: Normal mood and affect perceived. Normal judgment  and thought content.  Physical exam deferred due to nature of the encounter  Labs and Imaging No results found for this or any previous visit (from the past 336 hour(s)). No results found.    Assessment and Plan:     Amenorrhea, or probably anovulatory     Will attempt to withdraw with Provera 14-day regimen of 10 mg.  If she has a withdrawal bleed will simply repeat this every 3 months when she is amenorrheic.  She is not sexually active so pregnancy is not a risk   I discussed the assessment and treatment plan with the patient. The patient was provided an opportunity to ask questions and all were answered. The patient agreed with the plan and demonstrated an understanding of the instructions.   The patient was advised to call back or seek an in-person evaluation/go to the ED if the symptoms worsen or if the condition fails to improve as anticipated.  I provided 8 minutes of non-face-to-face time during this encounter, plus review of records and documentation, which doubles the amount of time   By signing my name below, I, Tonya Burns, attest that this documentation has been prepared under the direction and in the presence of Tonya Kind, MD. Electronically Signed: [facility]. 01/22/19. 9:13 AM.  I personally performed the services described in this documentation, which was SCRIBED in my presence. The recorded information has been reviewed and considered accurate. It has been edited as necessary during review. Tonya Kind, MD

## 2019-03-28 ENCOUNTER — Other Ambulatory Visit: Payer: Self-pay | Admitting: Adult Health

## 2019-03-31 ENCOUNTER — Ambulatory Visit (INDEPENDENT_AMBULATORY_CARE_PROVIDER_SITE_OTHER): Payer: Commercial Managed Care - PPO | Admitting: Adult Health

## 2019-03-31 ENCOUNTER — Encounter: Payer: Self-pay | Admitting: Adult Health

## 2019-03-31 ENCOUNTER — Other Ambulatory Visit (HOSPITAL_COMMUNITY)
Admission: RE | Admit: 2019-03-31 | Discharge: 2019-03-31 | Disposition: A | Discharge status: Home / Self Care | Payer: Commercial Managed Care - PPO | Source: Ambulatory Visit | Attending: Adult Health | Admitting: Adult Health

## 2019-03-31 ENCOUNTER — Other Ambulatory Visit: Payer: Self-pay

## 2019-03-31 VITALS — BP 120/82 | HR 83 | Ht 62.0 in | Wt 197.0 lb

## 2019-03-31 DIAGNOSIS — Z01419 Encounter for gynecological examination (general) (routine) without abnormal findings: Secondary | ICD-10-CM | POA: Diagnosis not present

## 2019-03-31 DIAGNOSIS — Z8742 Personal history of other diseases of the female genital tract: Secondary | ICD-10-CM

## 2019-03-31 DIAGNOSIS — N926 Irregular menstruation, unspecified: Secondary | ICD-10-CM

## 2019-03-31 NOTE — Progress Notes (Signed)
Patient ID: Tonya Burns, female   DOB: 05/08/1990, 28 y.o.   MRN: [id number] History of Present Illness: Tonya Burns is a 28 year old black female, single, G2P0201, in for a well woman gyn exam and pap Her last pap was LSIL 03/09/16 she did not get colpo in December 2017. PCP is Dr Posey Pronto.    Current Medications, Allergies, Past Medical History, Past Surgical History, Family History and Social History were reviewed in [employer] record.     Review of Systems:  Patient denies any headaches, hearing loss, fatigue, blurred vision, shortness of breath, chest pain, abdominal pain, problems with bowel movements, urination, or intercourse(no sex in about 2 years). No joint pain or mood swings. Periods irregular, LMP October 1, has provera if goes over 3 months    Physical Exam:BP 120/82 (BP Location: Right Arm, Patient Position: Sitting, Cuff Size: Normal)   Pulse 83   Ht 5\' 2"  (1.575 m)   Wt 197 lb (89.4 kg)   LMP 01/09/2019   BMI 36.03 kg/m  General:  Well developed, well nourished, no acute distress Skin:  Warm and dry Neck:  Midline trachea, normal thyroid, good ROM, no lymphadenopathy Lungs; Clear to auscultation bilaterally Breast:  No dominant palpable mass, retraction, or nipple discharge Cardiovascular: Regular rate and rhythm Abdomen:  Soft, non tender, no hepatosplenomegaly Pelvic:  External genitalia is normal in appearance, no lesions.  The vagina is normal in appearance. Urethra has no lesions or masses. The cervix is bulbous. Pap with GC/CHL and high risk 16/18 genotyping performed. Uterus is felt to be normal size, shape, and contour.  No adnexal masses or tenderness noted.Bladder is non tender, no masses felt. Extremities/musculoskeletal:  No swelling or varicosities noted, no clubbing or cyanosis Psych:  No mood changes, alert and cooperative,seems happy Fall risk is low PHQ 2 score is 0. Examination chaperoned by Estill Bamberg Rash LPN.  Impression and  Plan: 1. Encounter for gynecological examination with Papanicolaou smear of cervix Physical in 1 year Pap sent Pap in 3 if normal Labs with PCP    2. History of abnormal cervical Pap smear   3. Irregular periods Use provera every 3 months as needed

## 2019-04-08 LAB — CYTOLOGY - PAP
Chlamydia: NEGATIVE
Comment: NEGATIVE
Comment: NEGATIVE
Comment: NEGATIVE
Comment: NEGATIVE
Comment: NORMAL
Diagnosis: UNDETERMINED — AB
HPV 16: NEGATIVE
HPV 18 / 45: NEGATIVE
High risk HPV: POSITIVE — AB
Neisseria Gonorrhea: NEGATIVE

## 2019-04-09 ENCOUNTER — Encounter: Payer: Self-pay | Admitting: Adult Health

## 2019-04-09 ENCOUNTER — Telehealth: Payer: Self-pay | Admitting: Adult Health

## 2019-04-09 DIAGNOSIS — R87618 Other abnormal cytological findings on specimens from cervix uteri: Secondary | ICD-10-CM

## 2019-04-09 DIAGNOSIS — R8789 Other abnormal findings in specimens from female genital organs: Secondary | ICD-10-CM | POA: Insufficient documentation

## 2019-04-09 HISTORY — DX: Other abnormal findings in specimens from female genital organs: R87.89

## 2019-04-09 HISTORY — DX: Other abnormal cytological findings on specimens from cervix uteri: R87.618

## 2019-04-09 NOTE — Telephone Encounter (Signed)
Pt aware that pap was ASCUS with +HPV, not 16 or 18 but had LSIL pap in 2017 and did not get colpo, will get colpo with Dr Elonda Husky

## 2019-04-15 ENCOUNTER — Encounter: Payer: Commercial Managed Care - PPO | Admitting: Obstetrics & Gynecology

## 2019-04-21 ENCOUNTER — Encounter: Payer: Commercial Managed Care - PPO | Admitting: Obstetrics & Gynecology

## 2019-04-25 ENCOUNTER — Ambulatory Visit (INDEPENDENT_AMBULATORY_CARE_PROVIDER_SITE_OTHER): Payer: Commercial Managed Care - PPO | Admitting: Obstetrics & Gynecology

## 2019-04-25 ENCOUNTER — Other Ambulatory Visit: Payer: Self-pay

## 2019-04-25 ENCOUNTER — Encounter: Payer: Self-pay | Admitting: Obstetrics & Gynecology

## 2019-04-25 VITALS — Wt 197.0 lb

## 2019-04-25 DIAGNOSIS — R87610 Atypical squamous cells of undetermined significance on cytologic smear of cervix (ASC-US): Secondary | ICD-10-CM

## 2019-04-25 DIAGNOSIS — R87810 Cervical high risk human papillomavirus (HPV) DNA test positive: Secondary | ICD-10-CM

## 2019-04-25 DIAGNOSIS — Z3202 Encounter for pregnancy test, result negative: Secondary | ICD-10-CM | POA: Diagnosis not present

## 2019-04-25 LAB — POCT URINE PREGNANCY: Preg Test, Ur: NEGATIVE

## 2019-04-25 NOTE — Addendum Note (Signed)
Addended by: Moss Mc on: 04/25/2019 10:04 AM   Modules accepted: Orders

## 2019-04-25 NOTE — Progress Notes (Signed)
Colposcopy Procedure Note:  Colposcopy Procedure Note  Indications:  2020    ASCUS/ +HR HPV/ negative 16/18 2017    LSIL   Smoker:  No. New sexual partner:  No.  : time frame:  No.  History of abnormal Pap: yes  Procedure Details  The risks and benefits of the procedure and Written informed consent obtained.  Speculum placed in vagina and excellent visualization of cervix achieved, cervix swabbed x 3 with acetic acid solution.  Findings: Cervix: no visible lesions, no mosaicism, no punctation and no abnormal vasculature; SCJ visualized 360 degrees without lesions and no biopsies taken. Vaginal inspection: vaginal colposcopy normal Vulvar colposcopy: vulvar colposcopy not performed.  Specimens: none  Complications: none.  Plan: HPV based cytology 1 year

## 2019-07-09 ENCOUNTER — Other Ambulatory Visit: Payer: Commercial Managed Care - PPO

## 2019-07-09 ENCOUNTER — Other Ambulatory Visit (HOSPITAL_COMMUNITY)
Admission: RE | Admit: 2019-07-09 | Discharge: 2019-07-09 | Disposition: A | Discharge status: Home / Self Care | Payer: Commercial Managed Care - PPO | Source: Ambulatory Visit | Attending: Obstetrics & Gynecology | Admitting: Obstetrics & Gynecology

## 2019-07-09 ENCOUNTER — Other Ambulatory Visit: Payer: Self-pay

## 2019-07-09 VITALS — Ht 62.0 in | Wt 199.0 lb

## 2019-07-09 DIAGNOSIS — Z113 Encounter for screening for infections with a predominantly sexual mode of transmission: Secondary | ICD-10-CM

## 2019-07-09 NOTE — Progress Notes (Signed)
   NURSE VISIT- VAGINITIS/STD/POC  SUBJECTIVE:  Tonya Burns is a 29 y.o. G40P0201 female here for a vaginal swab for std screening}.  She reports no symptoms. Has new sex partner.    OBJECTIVE:  Ht 5\' 2"  (1.575 m)   Wt 199 lb (90.3 kg)   BMI 36.40 kg/m   Appears well, in no apparent distress  ASSESSMENT: Vaginal swab for std screening  PLAN: Self-collected vaginal probe for std screening} sent to lab Treatment: to be determined once results are received Follow-up as needed if symptoms persist/worsen, or new symptoms develop   07/09/2019 3:42 PM

## 2019-07-11 LAB — CERVICOVAGINAL ANCILLARY ONLY
Bacterial Vaginitis (gardnerella): POSITIVE — AB
Candida Glabrata: NEGATIVE
Candida Vaginitis: NEGATIVE
Chlamydia: NEGATIVE
Comment: NEGATIVE
Comment: NEGATIVE
Comment: NEGATIVE
Comment: NEGATIVE
Comment: NEGATIVE
Comment: NORMAL
Neisseria Gonorrhea: NEGATIVE
Trichomonas: NEGATIVE

## 2019-08-04 ENCOUNTER — Telehealth: Payer: Commercial Managed Care - PPO | Admitting: Physician Assistant

## 2019-08-04 ENCOUNTER — Telehealth: Payer: Self-pay | Admitting: Obstetrics & Gynecology

## 2019-08-04 DIAGNOSIS — N898 Other specified noninflammatory disorders of vagina: Secondary | ICD-10-CM

## 2019-08-04 MED ORDER — MEGESTROL ACETATE 40 MG PO TABS: ORAL_TABLET | ORAL | 3 refills | Status: DC

## 2019-08-04 NOTE — Progress Notes (Signed)
Based on what you shared with me, I do not have enough information to treat your complaint. On reviewing your chart, I see that you contacted your OB-GYN and were able to secure a prescription to help with vaginal bleeding. If you have any other concerns you can schedule an e-visit  Or be seen in person for your symptoms.   NOTE: If you entered your credit card information for this eVisit, you will not be charged. You may see a "hold" on your card for the $35 but that hold will drop off and you will not have a charge processed.   If you are having a true medical emergency please call 911.      For an urgent face to face visit, [place] has five urgent care centers for your convenience:      NEW:  [facility] at [facility] Directions [phone] [address] [place], [place] [address] . 10 am - 6pm Monday - Friday    [facility]) Get Driving Directions [phone] [address] [place], [place] [address] . 10 am to 8 pm Monday-Friday . 12 pm to 8 pm [facility] Ur[facility] at [facility] Get Driving Directions [phone] [address], [address] [place], [place] [address] . 8 am to 8 pm Monday-Friday . 9 am to 6 pm Saturday . 11 am to 6 pm Sunday     [facility] Urgent Care at [facility] Get Driving Directions  [phone] [address] [place], [place] [address] . 8 am to 8 pm Monday-Friday . 8 am to 4 pm [facility] Urgent Care at [facility] Directions [phone] [address] [place], [place] [address] . 12 pm to 6 pm Monday-Friday      Your e-visit answers were reviewed by a board certified advanced clinical practitioner to complete your personal care plan.  Thank you for using e-Visits.   Greater than 5 minutes, yet less than 10 minutes of time have been spend researching, coordinating, and  implementing care for this patient today.

## 2020-01-01 ENCOUNTER — Ambulatory Visit: Payer: Commercial Managed Care - PPO | Admitting: Obstetrics and Gynecology

## 2020-01-05 ENCOUNTER — Ambulatory Visit: Payer: Commercial Managed Care - PPO | Admitting: Obstetrics and Gynecology

## 2020-01-09 ENCOUNTER — Ambulatory Visit (INDEPENDENT_AMBULATORY_CARE_PROVIDER_SITE_OTHER): Payer: Commercial Managed Care - PPO | Admitting: Women's Health

## 2020-01-09 ENCOUNTER — Encounter: Payer: Self-pay | Admitting: Women's Health

## 2020-01-09 ENCOUNTER — Other Ambulatory Visit: Payer: Self-pay

## 2020-01-09 VITALS — BP 127/76 | HR 96 | Ht 62.0 in | Wt 191.8 lb

## 2020-01-09 DIAGNOSIS — Z3202 Encounter for pregnancy test, result negative: Secondary | ICD-10-CM | POA: Diagnosis not present

## 2020-01-09 DIAGNOSIS — Z30011 Encounter for initial prescription of contraceptive pills: Secondary | ICD-10-CM

## 2020-01-09 LAB — POCT URINE PREGNANCY: Preg Test, Ur: NEGATIVE

## 2020-01-09 MED ORDER — LO LOESTRIN FE 1 MG-10 MCG / 10 MCG PO TABS: 1.0000 | ORAL_TABLET | Freq: Every day | ORAL | 3 refills | Status: DC

## 2020-01-09 NOTE — Progress Notes (Signed)
   GYN VISIT Patient name: Tonya Burns MRN [id number]  Date of birth: Jan 19, 1991 Chief Complaint:   Discuss Birth Control  History of Present Illness:   Tonya Burns is a 29 y.o. G4P0201 African American female being seen today to discuss starting birth control, wants to do pills. Does not smoke, no h/o HTN, DVT/PE, CVA, MI, or migraines w/ aura.     Depression screen [facility] 2/9 03/31/2019  Decreased Interest 0  Down, Depressed, Hopeless 0  PHQ - 2 Score 0    Patient's last menstrual period was 01/03/2020 (exact date). The current method of family planning is none.  Last pap 03/31/19. Results were:  ASCUS w/ +HRHPV Review of Systems:   Pertinent items are noted in HPI Denies fever/chills, dizziness, headaches, visual disturbances, fatigue, shortness of breath, chest pain, abdominal pain, vomiting, abnormal vaginal discharge/itching/odor/irritation, problems with periods, bowel movements, urination, or intercourse unless otherwise stated above.  Pertinent History Reviewed:  Reviewed past medical,surgical, social, obstetrical and family history.  Reviewed problem list, medications and allergies. Physical Assessment:   Vitals:   01/09/20 0848  BP: 127/76  Pulse: 96  Weight: 191 lb 12.8 oz (87 kg)  Height: 5\' 2"  (1.575 m)  Body mass index is 35.08 kg/m.       Physical Examination:   General appearance: alert, well appearing, and in no distress  Mental status: alert, oriented to person, place, and time  Skin: warm & dry   Cardiovascular: normal heart rate noted  Respiratory: normal respiratory effort, no distress  Abdomen: soft, non-tender   Pelvic: examination not indicated  Extremities: no edema   Chaperone: n/a    Results for orders placed or performed in visit on 01/09/20 (from the past 24 hour(s))  POCT urine pregnancy   Collection Time: 01/09/20  8:49 AM  Result Value Ref Range   Preg Test, Ur Negative Negative    Assessment & Plan:  1) Contraception management>  rx LoLoestrin, condoms x 2wks, 3 sample packs given  2) H/O abnormal pap> schedule repeat today  Meds:  Meds ordered this encounter  Medications  . LO LOESTRIN FE 1 MG-10 MCG / 10 MCG tablet    Sig: Take 1 tablet by mouth daily.    Dispense:  90 tablet    Refill:  3    For co-pay card, pt to text "Lo Loestrin Fe " to [phone]              Co-pay card must be run in second position  "other coverage code 3"  if denied d/t PA, step edit, or insurance denial    Order Specific Question:   Supervising Provider    Answer:   [id number] H [[id number]]    Orders Placed This Encounter  Procedures  . POCT urine pregnancy    Return in about 3 months (around 04/10/2020) for Pap & physical.  06/08/2020 CNM, WHNP-BC 01/09/2020 9:02 AM

## 2020-01-09 NOTE — Progress Notes (Signed)
Error [facility] 

## 2020-01-18 ENCOUNTER — Other Ambulatory Visit: Payer: Self-pay | Admitting: Obstetrics & Gynecology

## 2020-04-26 ENCOUNTER — Encounter: Payer: Self-pay | Admitting: *Deleted

## 2020-04-27 ENCOUNTER — Other Ambulatory Visit: Payer: Commercial Managed Care - PPO | Admitting: Women's Health

## 2020-06-09 ENCOUNTER — Other Ambulatory Visit: Payer: Self-pay

## 2020-06-09 ENCOUNTER — Other Ambulatory Visit (HOSPITAL_COMMUNITY)
Admission: RE | Admit: 2020-06-09 | Discharge: 2020-06-09 | Disposition: A | Discharge status: Home / Self Care | Payer: Commercial Managed Care - PPO | Source: Ambulatory Visit | Attending: Obstetrics & Gynecology | Admitting: Obstetrics & Gynecology

## 2020-06-09 ENCOUNTER — Other Ambulatory Visit: Payer: Self-pay | Admitting: Adult Health

## 2020-06-09 ENCOUNTER — Other Ambulatory Visit (INDEPENDENT_AMBULATORY_CARE_PROVIDER_SITE_OTHER): Payer: Commercial Managed Care - PPO

## 2020-06-09 DIAGNOSIS — N898 Other specified noninflammatory disorders of vagina: Secondary | ICD-10-CM | POA: Insufficient documentation

## 2020-06-09 MED ORDER — LO LOESTRIN FE 1 MG-10 MCG / 10 MCG PO TABS: 1.0000 | ORAL_TABLET | Freq: Every day | ORAL | 0 refills | Status: DC

## 2020-06-09 NOTE — Progress Notes (Addendum)
   NURSE VISIT- VAGINITIS/STD/POC  SUBJECTIVE:  Tonya Burns is a 30 y.o. T0P5465 GYN patientfemale here for a vaginal swab for vaginitis screening, STD screen.  She reports the following symptoms: discharge described as malodorous, green and mucous and odor for 2 weeks. Denies abnormal vaginal bleeding, significant pelvic pain, fever, or UTI symptoms.  OBJECTIVE:  There were no vitals taken for this visit.  Appears well, in no apparent distress  ASSESSMENT: Vaginal swab for vaginitis screening  PLAN: Self-collected vaginal probe for Gonorrhea, Chlamydia, Trichomonas, Bacterial Vaginosis, Yeast sent to lab Treatment: to be determined once results are received Follow-up as needed if symptoms persist/worsen, or new symptoms develop  Brees Hounshell A Keyah Blizard  06/09/2020 10:18 AM   Chart reviewed for nurse visit. Agree with plan of care.  Cheral Marker, [place] 06/09/2020 10:44 AM

## 2020-06-10 ENCOUNTER — Other Ambulatory Visit: Payer: Self-pay | Admitting: Women's Health

## 2020-06-10 ENCOUNTER — Telehealth: Payer: Self-pay | Admitting: *Deleted

## 2020-06-10 ENCOUNTER — Telehealth: Payer: Self-pay | Admitting: Women's Health

## 2020-06-10 LAB — CERVICOVAGINAL ANCILLARY ONLY
Bacterial Vaginitis (gardnerella): POSITIVE — AB
Candida Glabrata: NEGATIVE
Candida Vaginitis: NEGATIVE
Chlamydia: POSITIVE — AB
Comment: NEGATIVE
Comment: NEGATIVE
Comment: NEGATIVE
Comment: NEGATIVE
Comment: NEGATIVE
Comment: NORMAL
Neisseria Gonorrhea: NEGATIVE
Trichomonas: NEGATIVE

## 2020-06-10 MED ORDER — AZITHROMYCIN 500 MG PO TABS: 1000.0000 mg | ORAL_TABLET | Freq: Once | ORAL | 0 refills | Status: AC

## 2020-06-10 MED ORDER — METRONIDAZOLE 500 MG PO TABS: 500.0000 mg | ORAL_TABLET | Freq: Two times a day (BID) | ORAL | 0 refills | Status: DC

## 2020-06-10 NOTE — Telephone Encounter (Signed)
Tonya Burns aware that her partner's med was sent to pharmacy. JSY

## 2020-06-10 NOTE — Telephone Encounter (Signed)
Patient called stating that she would like to speak with Mrs. Selena Batten or her nurse, patient did not state the reason for the call. Please contact pt

## 2020-06-10 NOTE — Telephone Encounter (Signed)
Pt aware of her swab results. Pt wants you to treat partner. Tonya Burns, Tonya Burns DOB [birth year] no allergies [facility] [place]. Advised no sex for at least 7 days after both have finished med. Call transferred to [facility] for POC appt in 4 weeks. JSY

## 2020-06-10 NOTE — Telephone Encounter (Signed)
Azithromycin 1gm po x 1 w/ 0RF called into [facility] [place] for partner Darcel Bayley III DOB [birth year].  Cheral Marker, CNM, [facility] 06/10/2020 4:17 PM

## 2020-06-11 ENCOUNTER — Telehealth: Payer: Self-pay | Admitting: Women's Health

## 2020-06-11 NOTE — Telephone Encounter (Signed)
Pt called, states [facility] said [facility] did not receive [facility] for her partner, please see info below and order again  Pt asked that we call her to let her know when it's ordered so she can go back to the pharmacy     Colen Darling, LPN   08/10/82 1:32 PM Note Arti aware that her partner's med was sent to pharmacy. JSY  Cheral Marker, CNM to Colen Darling, LPN    07/13/99 0:27 PM done   Cheral Marker, CNM   06/10/20 4:18 PM Note Azithromycin 1gm po x 1 w/ 0RF called [facility] [place] for partner Darcel Bayley III DOB [birth year].  Cheral Marker, CNM, WHNP-BC 06/10/2020 4:17 PM    06/10/20 3:19 PM [place] Earlyne Iba, LPN routed this conversation to Cheral Marker, CNM   Colen Darling, LPN   05/15/34 6:44 PM Note Pt aware of her swab results. Pt wants you to treat partner. Darcel Bayley, III DOB [birth year] no allergies [facility] [place]. Advised no sex for at least 7 days after both have finished med. Call transferred to [facility] for POC appt in 4 weeks. JSY

## 2020-06-11 NOTE — Telephone Encounter (Signed)
Returned pt's call. She stated that she checked last night and this morning with [facility] and they did not have her partner's medication. [facility] and confirmed they did not [facility] it. Gave a verbal order and called the pt to update her. Pt confirmed understanding.

## 2020-07-07 ENCOUNTER — Other Ambulatory Visit: Payer: Self-pay

## 2020-07-07 ENCOUNTER — Ambulatory Visit (INDEPENDENT_AMBULATORY_CARE_PROVIDER_SITE_OTHER): Payer: Commercial Managed Care - PPO | Admitting: Women's Health

## 2020-07-07 ENCOUNTER — Other Ambulatory Visit (HOSPITAL_COMMUNITY)
Admission: RE | Admit: 2020-07-07 | Discharge: 2020-07-07 | Disposition: A | Discharge status: Home / Self Care | Payer: Commercial Managed Care - PPO | Source: Ambulatory Visit | Attending: Obstetrics & Gynecology | Admitting: Obstetrics & Gynecology

## 2020-07-07 ENCOUNTER — Encounter: Payer: Self-pay | Admitting: Women's Health

## 2020-07-07 VITALS — BP 117/75 | HR 81 | Ht 62.0 in | Wt 184.8 lb

## 2020-07-07 DIAGNOSIS — Z01419 Encounter for gynecological examination (general) (routine) without abnormal findings: Secondary | ICD-10-CM | POA: Diagnosis present

## 2020-07-07 NOTE — Progress Notes (Signed)
   GYN VISIT Patient name: Tonya Burns MRN [id number]  Date of birth: 1990-10-09 Chief Complaint:   Contraception and POC  History of Present Illness:   Tonya Burns is a 30 y.o. G74P0201 African American female being seen today for CT POC and to discuss birth control. Did not pick up COCs d/t being $500 out of pocket. Has not been using condoms. Friend has Nexplanon, and she wants that. Had unprotected sex last night.     Depression screen [facility] 2/9 03/31/2019  Decreased Interest 0  Down, Depressed, Hopeless 0  PHQ - 2 Score 0    Patient's last menstrual period was 06/18/2020 (exact date). The current method of family planning is none.  Last pap 03/31/19. Results were: ASCUS w/ HRHPV positive w/ normal colpo Review of Systems:   Pertinent items are noted in HPI Denies fever/chills, dizziness, headaches, visual disturbances, fatigue, shortness of breath, chest pain, abdominal pain, vomiting, abnormal vaginal discharge/itching/odor/irritation, problems with periods, bowel movements, urination, or intercourse unless otherwise stated above.  Pertinent History Reviewed:  Reviewed past medical,surgical, social, obstetrical and family history.  Reviewed problem list, medications and allergies. Physical Assessment:   Vitals:   07/07/20 0913  BP: 117/75  Pulse: 81  Weight: 184 lb 12.8 oz (83.8 kg)  Height: 5\' 2"  (1.575 m)  Body mass index is 33.8 kg/m.       Physical Examination:   General appearance: alert, well appearing, and in no distress  Mental status: alert, oriented to person, place, and time  Skin: warm & dry   Cardiovascular: normal heart rate noted  Respiratory: normal respiratory effort, no distress  Abdomen: soft, non-tender   Pelvic: VULVA: normal appearing vulva with no masses, tenderness or lesions, VAGINA: normal appearing vagina with normal color and discharge, no lesions, CERVIX: normal appearing cervix without discharge or lesions  Extremities: no edema    Chaperone: Angel Neas    No results found for this or any previous visit (from the past 24 hour(s)).  Assessment & Plan:  1) Recent chlamydia> POC today on pap  2) Contraception counseling> abstinence until Nexplanon insertion on 4/12 (14d from last sex)  3) H/O abnormal pap> past due for pap, done today  Meds: No orders of the defined types were placed in this encounter.   No orders of the defined types were placed in this encounter.   Return for 4/12 for , Nexplanon insertion.  6/12 CNM, [facility] 07/07/2020 9:35 AM

## 2020-07-08 ENCOUNTER — Other Ambulatory Visit: Payer: Commercial Managed Care - PPO

## 2020-07-12 LAB — CYTOLOGY - PAP
Chlamydia: NEGATIVE
Comment: NEGATIVE
Comment: NEGATIVE
Comment: NORMAL
Diagnosis: NEGATIVE
Diagnosis: REACTIVE
High risk HPV: NEGATIVE
Neisseria Gonorrhea: NEGATIVE

## 2020-07-20 ENCOUNTER — Encounter: Payer: Commercial Managed Care - PPO | Admitting: Women's Health

## 2020-11-30 ENCOUNTER — Other Ambulatory Visit: Payer: Commercial Managed Care - PPO | Admitting: Obstetrics & Gynecology

## 2021-01-14 ENCOUNTER — Other Ambulatory Visit: Payer: Commercial Managed Care - PPO

## 2021-01-20 ENCOUNTER — Other Ambulatory Visit (INDEPENDENT_AMBULATORY_CARE_PROVIDER_SITE_OTHER): Payer: Commercial Managed Care - PPO

## 2021-01-20 ENCOUNTER — Telehealth: Payer: Self-pay | Admitting: Adult Health

## 2021-01-20 ENCOUNTER — Other Ambulatory Visit (HOSPITAL_COMMUNITY)
Admission: RE | Admit: 2021-01-20 | Discharge: 2021-01-20 | Disposition: A | Discharge status: Home / Self Care | Payer: Commercial Managed Care - PPO | Source: Ambulatory Visit | Attending: Obstetrics & Gynecology | Admitting: Obstetrics & Gynecology

## 2021-01-20 ENCOUNTER — Other Ambulatory Visit: Payer: Self-pay

## 2021-01-20 DIAGNOSIS — N939 Abnormal uterine and vaginal bleeding, unspecified: Secondary | ICD-10-CM

## 2021-01-20 DIAGNOSIS — R399 Unspecified symptoms and signs involving the genitourinary system: Secondary | ICD-10-CM

## 2021-01-20 LAB — POCT URINALYSIS DIPSTICK OB
Glucose, UA: NEGATIVE
Ketones, UA: NEGATIVE
Leukocytes, UA: NEGATIVE
Nitrite, UA: POSITIVE
POC,PROTEIN,UA: NEGATIVE

## 2021-01-20 MED ORDER — NITROFURANTOIN MONOHYD MACRO 100 MG PO CAPS: 100.0000 mg | ORAL_CAPSULE | Freq: Two times a day (BID) | ORAL | 0 refills | Status: DC

## 2021-01-20 NOTE — Telephone Encounter (Signed)
I called pt and let her know rx sent in for Macrobid,had nitrates on urine dipstick

## 2021-01-20 NOTE — Progress Notes (Addendum)
   NURSE VISIT- VAGINITIS/STD/POC  SUBJECTIVE:  Tonya Burns is a 30 y.o. D1V6160 GYN patientfemale here for a vaginal swab for vaginitis screening, STD screen.  She reports the following symptoms: odor and spotting  for 2 days. Denies significant pelvic pain, or fever.  OBJECTIVE:  There were no vitals taken for this visit.  Appears well, in no apparent distress  ASSESSMENT: Vaginal swab for  vaginitis & STD screening  PLAN: Self-collected vaginal probe for Gonorrhea, Chlamydia, Trichomonas, Bacterial Vaginosis, Yeast sent to lab Treatment: to be determined once results are received Follow-up as needed if symptoms persist/worsen, or new symptoms develop    NURSE VISIT- UTI SYMPTOMS   SUBJECTIVE:  Tonya Burns is a 30 y.o. G69P0201 female here for UTI symptoms. She is a GYN patient. She reports urinary frequency, urinary incontinence, urinary urgency, and burning .  OBJECTIVE:  There were no vitals taken for this visit.  Appears well, in no apparent distress  Results for orders placed or performed in visit on 01/20/21 (from the past 24 hour(s))  POC Urinalysis Dipstick OB   Collection Time: 01/20/21  3:51 PM  Result Value Ref Range   Color, UA     Clarity, UA     Glucose, UA Negative Negative   Bilirubin, UA     Ketones, UA neg    Spec Grav, UA     Blood, UA large    pH, UA     POC,PROTEIN,UA Negative Negative, Trace, Small (1+), Moderate (2+), Large (3+), 4+   Urobilinogen, UA     Nitrite, UA pos    Leukocytes, UA Negative Negative   Appearance     Odor      ASSESSMENT: GYN patient with UTI symptoms and positive nitrites  PLAN: Note routed to Cyril Mourning, AGNP   Rx sent by provider today: Yes Urine culture sent Call or return to clinic prn if these symptoms worsen or fail to improve as anticipated. Follow-up: as needed   Catrell Morrone A Lonzell Dorris  01/20/2021 3:53 PM

## 2021-01-20 NOTE — Progress Notes (Signed)
Chart reviewed for nurse visit. Agree with plan of care. Will rx April Manson, NP 01/20/2021 4:04 PM

## 2021-01-20 NOTE — Addendum Note (Signed)
Addended by: Cyril Mourning A on: 01/20/2021 04:05 PM   Modules accepted: Orders

## 2021-01-24 ENCOUNTER — Other Ambulatory Visit: Payer: Self-pay | Admitting: Obstetrics & Gynecology

## 2021-01-24 ENCOUNTER — Other Ambulatory Visit: Payer: Self-pay | Admitting: Adult Health

## 2021-01-24 LAB — CERVICOVAGINAL ANCILLARY ONLY
Bacterial Vaginitis (gardnerella): POSITIVE — AB
Candida Glabrata: NEGATIVE
Candida Vaginitis: POSITIVE — AB
Chlamydia: NEGATIVE
Comment: NEGATIVE
Comment: NEGATIVE
Comment: NEGATIVE
Comment: NEGATIVE
Comment: NEGATIVE
Comment: NORMAL
Neisseria Gonorrhea: NEGATIVE
Trichomonas: NEGATIVE

## 2021-01-24 MED ORDER — METRONIDAZOLE 500 MG PO TABS: 500.0000 mg | ORAL_TABLET | Freq: Two times a day (BID) | ORAL | 0 refills | Status: DC

## 2021-01-24 MED ORDER — FLUCONAZOLE 150 MG PO TABS: ORAL_TABLET | ORAL | 1 refills | Status: DC

## 2021-01-24 NOTE — Progress Notes (Signed)
+  BV and yeast, on vaginal swab will rx flagyl and diflucan

## 2021-01-25 LAB — MICROSCOPIC EXAMINATION
Casts: NONE SEEN /lpf
WBC, UA: >30 /hpf — AB (ref 0–5)

## 2021-01-25 LAB — URINALYSIS, ROUTINE W REFLEX MICROSCOPIC
Bilirubin, UA: NEGATIVE
Glucose, UA: NEGATIVE
Ketones, UA: NEGATIVE
Nitrite, UA: POSITIVE — AB
Specific Gravity, UA: 1.026 (ref 1.005–1.030)
Urobilinogen, Ur: 0.2 mg/dL (ref 0.2–1.0)
pH, UA: 5.5 (ref 5.0–7.5)

## 2021-01-25 LAB — URINE CULTURE

## 2021-01-31 ENCOUNTER — Other Ambulatory Visit: Payer: Self-pay | Admitting: Women's Health

## 2021-01-31 MED ORDER — VALACYCLOVIR HCL 1 G PO TABS: 1000.0000 mg | ORAL_TABLET | Freq: Every day | ORAL | 3 refills | Status: DC

## 2021-02-24 ENCOUNTER — Ambulatory Visit: Payer: Commercial Managed Care - PPO | Admitting: Advanced Practice Midwife

## 2021-03-07 ENCOUNTER — Ambulatory Visit: Payer: Commercial Managed Care - PPO | Admitting: Adult Health

## 2021-03-22 ENCOUNTER — Ambulatory Visit: Payer: Commercial Managed Care - PPO | Admitting: Adult Health

## 2021-03-22 ENCOUNTER — Other Ambulatory Visit: Payer: Self-pay

## 2021-04-15 ENCOUNTER — Ambulatory Visit: Payer: Commercial Managed Care - PPO | Admitting: Adult Health

## 2021-04-28 ENCOUNTER — Other Ambulatory Visit: Payer: Self-pay

## 2021-04-28 ENCOUNTER — Encounter: Payer: Self-pay | Admitting: Adult Health

## 2021-04-28 ENCOUNTER — Ambulatory Visit: Payer: Commercial Managed Care - PPO | Admitting: Adult Health

## 2021-04-28 VITALS — BP 109/70 | HR 82 | Ht 62.0 in | Wt 176.8 lb

## 2021-04-28 DIAGNOSIS — Z319 Encounter for procreative management, unspecified: Secondary | ICD-10-CM | POA: Insufficient documentation

## 2021-04-28 DIAGNOSIS — N926 Irregular menstruation, unspecified: Secondary | ICD-10-CM | POA: Diagnosis not present

## 2021-04-28 DIAGNOSIS — Z3202 Encounter for pregnancy test, result negative: Secondary | ICD-10-CM

## 2021-04-28 LAB — POCT URINE PREGNANCY: Preg Test, Ur: NEGATIVE

## 2021-04-28 MED ORDER — MEDROXYPROGESTERONE ACETATE 10 MG PO TABS: 10.0000 mg | ORAL_TABLET | Freq: Every day | ORAL | 0 refills | Status: DC

## 2021-04-28 MED ORDER — PRENATAL PLUS 27-1 MG PO TABS: 1.0000 | ORAL_TABLET | Freq: Every day | ORAL | 12 refills | Status: DC

## 2021-04-28 NOTE — Progress Notes (Signed)
°  Subjective:     Patient ID: Tonya Burns, female   DOB: Nov 30, 1990, 30 y.o.   MRN: [id number]  HPI [name] is a 31 year old black female, single, G2P0201, in complaining of having missed periods and wants to get pregnant. Lab Results  Component Value Date   DIAGPAP  07/07/2020    - Negative for Intraepithelial Lesions or Malignancy (NILM)   DIAGPAP - Benign reactive/reparative changes 07/07/2020   HPVHIGH Negative 07/07/2020   PCP is Dr Allena Katz.  Review of Systems Has missed periods Periods not regular    Reviewed past medical,surgical, social and family history. Reviewed medications and allergies.  Objective:   Physical Exam BP 109/70 (BP Location: Right Arm, Patient Position: Sitting, Cuff Size: Normal)    Pulse 82    Ht 5\' 2"  (1.575 m)    Wt 176 lb 12.8 oz (80.2 kg)    LMP  (LMP Unknown) Comment: over 2 months ago   BMI 32.34 kg/m     UPT is negative Skin warm and dry. Lungs: clear to ausculation bilaterally. Cardiovascular: regular rate and rhythm.   Upstream - 04/28/21 1506       Pregnancy Intention Screening   Does the patient want to become pregnant in the next year? Yes    Does the patient's partner want to become pregnant in the next year? Yes    Would the patient like to discuss contraceptive options today? No      Contraception Wrap Up   Current Method Pregnant/Seeking Pregnancy    End Method Pregnant/Seeking Pregnancy    Contraception Counseling Provided No             Assessment:     1. Missed period Will rx provera 10 mg 1 daily for 10 days to get withdrawal bleed  2. Irregular periods   3. Patient desires pregnancy Will rx prenatal vitamins  Meds ordered this encounter  Medications   medroxyPROGESTERone (PROVERA) 10 MG tablet    Sig: Take 1 tablet (10 mg total) by mouth daily.    Dispense:  10 tablet    Refill:  0    Order Specific Question:   Supervising Provider    Answer:   04/30/21, LUTHER H [[id number]]   prenatal vitamin w/FE, FA (PRENATAL 1 +  1) 27-1 MG TABS tablet    Sig: Take 1 tablet by mouth daily at 12 noon.    Dispense:  30 tablet    Refill:  12    Order Specific Question:   Supervising Provider    Answer:   Despina Hidden [[id number]]       Plan:     Return in 2 weeks in follow up to see if bleeding Will check progesterone level day 21 of cycle, and if not ovulated will rx clomid she used with second pregnancy

## 2021-05-12 ENCOUNTER — Ambulatory Visit: Payer: Commercial Managed Care - PPO | Admitting: Adult Health

## 2021-05-12 ENCOUNTER — Encounter: Payer: Self-pay | Admitting: Adult Health

## 2021-05-12 ENCOUNTER — Other Ambulatory Visit: Payer: Self-pay

## 2021-05-12 VITALS — BP 112/73 | HR 71 | Ht 62.0 in | Wt 176.5 lb

## 2021-05-12 DIAGNOSIS — Z319 Encounter for procreative management, unspecified: Secondary | ICD-10-CM

## 2021-05-12 DIAGNOSIS — N926 Irregular menstruation, unspecified: Secondary | ICD-10-CM

## 2021-05-12 DIAGNOSIS — N911 Secondary amenorrhea: Secondary | ICD-10-CM | POA: Diagnosis not present

## 2021-05-12 NOTE — Progress Notes (Signed)
°  Subjective:     Patient ID: Tonya Burns, female   DOB: 12-15-1990, 31 y.o.   MRN: [id number]  HPI Arisbeth is a 31 year old black female,single, G2P0201,back in follow up of not having a period for several months and taking provera 10 mg for 10 days and no withdrawal bleeding. She wants to get pregnant. Lab Results  Component Value Date   DIAGPAP  07/07/2020    - Negative for Intraepithelial Lesions or Malignancy (NILM)   DIAGPAP - Benign reactive/reparative changes 07/07/2020   HPVHIGH Negative 07/07/2020   PCP is Dr Allena Katz.  Review of Systems +missed periods No bleeding after 10 days of provera Reviewed past medical,surgical, social and family history. Reviewed medications and allergies.     Objective:   Physical Exam BP 112/73 (BP Location: Left Arm, Patient Position: Sitting, Cuff Size: Normal)    Pulse 71    Ht 5\' 2"  (1.575 m)    Wt 176 lb 8 oz (80.1 kg)    LMP  (LMP Unknown) Comment: over 2 months ago   BMI 32.28 kg/m  Skin warm and dry.  Lungs: clear to ausculation bilaterally. Cardiovascular: regular rate and rhythm.    Fall risk is low  Upstream - 05/12/21 1504       Pregnancy Intention Screening   Does the patient want to become pregnant in the next year? Yes    Does the patient's partner want to become pregnant in the next year? Yes    Would the patient like to discuss contraceptive options today? No      Contraception Wrap Up   Current Method Pregnant/Seeking Pregnancy    End Method Pregnant/Seeking Pregnancy    Contraception Counseling Provided No             Assessment:     1. Missed period Will check labs  - Beta hCG quant (ref lab)  2. Patient desires pregnancy Continue pNV - Beta hCG quant (ref lab)  3. Amenorrhea, secondary Will check labs, if all normal will start clomid  Will talk when labs back  - Beta hCG quant (ref lab) - TSH - Follicle stimulating hormone - Estradiol - Prolactin     Plan:     Will talk when labs back She wants  to try clomid, has used before

## 2021-05-13 ENCOUNTER — Other Ambulatory Visit: Payer: Self-pay | Admitting: Adult Health

## 2021-05-13 DIAGNOSIS — Z319 Encounter for procreative management, unspecified: Secondary | ICD-10-CM

## 2021-05-13 LAB — BETA HCG QUANT (REF LAB): hCG Quant: <1 m[IU]/mL

## 2021-05-13 LAB — PROLACTIN: Prolactin: 13.1 ng/mL (ref 4.8–23.3)

## 2021-05-13 LAB — TSH: TSH: 2 u[IU]/mL (ref 0.450–4.500)

## 2021-05-13 LAB — FOLLICLE STIMULATING HORMONE: FSH: 6.6 m[IU]/mL

## 2021-05-13 LAB — ESTRADIOL: Estradiol: 28.7 pg/mL

## 2021-05-13 MED ORDER — CLOMIPHENE CITRATE 50 MG PO TABS: ORAL_TABLET | ORAL | 2 refills | Status: DC

## 2021-05-13 NOTE — Progress Notes (Signed)
Ck progesterone level 06/02/21 and rx sent for clomid

## 2021-07-02 ENCOUNTER — Other Ambulatory Visit: Payer: Self-pay | Admitting: Adult Health

## 2021-07-07 ENCOUNTER — Other Ambulatory Visit: Payer: Self-pay | Admitting: Adult Health

## 2021-07-18 ENCOUNTER — Ambulatory Visit: Payer: Commercial Managed Care - PPO | Admitting: Adult Health

## 2021-07-19 ENCOUNTER — Telehealth: Payer: Self-pay | Admitting: Adult Health

## 2021-08-08 ENCOUNTER — Ambulatory Visit: Payer: Commercial Managed Care - PPO | Admitting: Adult Health

## 2021-10-29 ENCOUNTER — Other Ambulatory Visit: Payer: Self-pay | Admitting: Adult Health

## 2021-11-23 ENCOUNTER — Ambulatory Visit: Payer: Commercial Managed Care - PPO | Admitting: Adult Health

## 2021-12-06 ENCOUNTER — Ambulatory Visit: Payer: Commercial Managed Care - PPO | Admitting: Adult Health

## 2021-12-13 ENCOUNTER — Ambulatory Visit: Payer: Commercial Managed Care - PPO | Admitting: Adult Health

## 2021-12-27 ENCOUNTER — Encounter: Payer: Self-pay | Admitting: Women's Health

## 2021-12-27 ENCOUNTER — Ambulatory Visit (INDEPENDENT_AMBULATORY_CARE_PROVIDER_SITE_OTHER): Payer: Commercial Managed Care - PPO | Admitting: Women's Health

## 2021-12-27 VITALS — BP 111/72 | HR 77 | Ht 62.0 in | Wt 174.8 lb

## 2021-12-27 DIAGNOSIS — N926 Irregular menstruation, unspecified: Secondary | ICD-10-CM

## 2021-12-27 DIAGNOSIS — B009 Herpesviral infection, unspecified: Secondary | ICD-10-CM

## 2021-12-27 DIAGNOSIS — Z01419 Encounter for gynecological examination (general) (routine) without abnormal findings: Secondary | ICD-10-CM | POA: Diagnosis not present

## 2021-12-27 MED ORDER — MEDROXYPROGESTERONE ACETATE 10 MG PO TABS: 10.0000 mg | ORAL_TABLET | Freq: Every day | ORAL | 6 refills | Status: DC

## 2021-12-27 MED ORDER — ACYCLOVIR 400 MG PO TABS: 400.0000 mg | ORAL_TABLET | Freq: Two times a day (BID) | ORAL | 3 refills | Status: DC

## 2021-12-27 NOTE — Progress Notes (Signed)
WELL-WOMAN EXAMINATION Patient name: Tonya Burns MRN [id number]  Date of birth: September 15, 1990 Chief Complaint:   Gynecologic Exam  History of Present Illness:   Tonya Burns is a 31 y.o. G66P0201 African-American female being seen today for a routine well-woman exam.  Current complaints: irregular periods, no longer trying to conceive. May skip 1 or 2 months, never 3 or more. Does want to get on something to regulate periods, but not birth control- makes her nauseated. Does wax chin- has a few hairs that grow. No acne, hair loss from head, or dark spots under arms or on neck.  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 Needs physical form signed for job- signed and returned to pt.   PCP: Posey Pronto      does not desire labs Patient's last menstrual period was 12/02/2021. The current method of family planning is abstinence.  Last pap 07/07/20. Results were: NILM w/ HRHPV negative. H/O abnormal pap: yes Last mammogram: never. Results were: N/A. Family h/o breast cancer: no Last colonoscopy: never. Results were: N/A. Family h/o colorectal cancer: no     12/27/2021    8:34 AM 03/31/2019    2:07 PM  Depression screen PHQ 2/9  Decreased Interest 0 0  Down, Depressed, Hopeless 0 0  PHQ - 2 Score 0 0  Altered sleeping 0   Tired, decreased energy 0   Change in appetite 0   Feeling bad or failure about yourself  0   Trouble concentrating 0   Moving slowly or fidgety/restless 0   Suicidal thoughts 0   PHQ-9 Score 0         12/27/2021    8:34 AM  GAD 7 : Generalized Anxiety Score  Nervous, Anxious, on Edge 0  Control/stop worrying 0  Worry too much - different things 0  Trouble relaxing 0  Restless 0  Easily annoyed or irritable 0  Afraid - awful might happen 0  Total GAD 7 Score 0     Review of Systems:   Pertinent items are noted in HPI Denies any headaches, blurred vision, fatigue, shortness of breath, chest pain, abdominal pain, abnormal  vaginal discharge/itching/odor/irritation, problems with periods, bowel movements, urination, or intercourse unless otherwise stated above. Pertinent History Reviewed:  Reviewed past medical,surgical, social and family history.  Reviewed problem list, medications and allergies. Physical Assessment:   Vitals:   12/27/21 0829  BP: 111/72  Pulse: 77  Weight: 174 lb 12.8 oz (79.3 kg)  Height: 5\' 2"  (1.575 m)  Body mass index is 31.97 kg/m.        Physical Examination:   General appearance - well appearing, and in no distress  Mental status - alert, oriented to person, place, and time  Psych:  She has a normal mood and affect  Skin - warm and dry, normal color, no suspicious lesions noted  Chest - effort normal, all lung fields clear to auscultation bilaterally  Heart - normal rate and regular rhythm  Neck:  midline trachea, no thyromegaly or nodules  Breasts - breasts appear normal, no suspicious masses, no skin or nipple changes or  axillary nodes  Abdomen - soft, nontender, nondistended, no masses or organomegaly  Pelvic - VULVA: normal appearing vulva with no masses, tenderness or lesions  VAGINA: normal appearing vagina with normal color and discharge, no lesions  CERVIX: normal appearing cervix without discharge or lesions, no CMT  Thin prep pap is not done   UTERUS: uterus is felt  to be normal size, shape, consistency and nontender   ADNEXA: No adnexal masses or tenderness noted.  Extremities:  No swelling or varicosities noted  Chaperone: Celene Squibb    No results found for this or any previous visit (from the past 24 hour(s)).  Assessment & Plan:  1) Well-Woman Exam  2) Irregular periods> since at least 2017 per chart, reviewed options, wants to have provera on hand to take q 2-30mths if no period. Let us know if doesn't bleed after taking.   3) HSV w/ outbreaks despite valtrex> will try switching to acyclovir  Labs/procedures today: exam  Mammogram: @ 30yo, or sooner  if problems Colonoscopy: @ 30yo, or sooner if problems  No orders of the defined types were placed in this encounter.   Meds:  Meds ordered this encounter  Medications   acyclovir (ZOVIRAX) 400 MG tablet    Sig: Take 1 tablet (400 mg total) by mouth 2 (two) times daily.    Dispense:  120 tablet    Refill:  3    Order Specific Question:   Supervising Provider    Answer:   Florian Buff [[id number]]   medroxyPROGESTERone (PROVERA) 10 MG tablet    Sig: Take 1 tablet (10 mg total) by mouth daily. Every 2-3 months if no period. Let us know if you don't bleed after taking pills    Dispense:  10 tablet    Refill:  6    Order Specific Question:   Supervising Provider    Answer:   Florian Buff [[id number]]    Follow-up: Return in about 1 year (around 12/28/2022) for Physical.  [place], WHNP-BC 12/27/2021 9:09 AM

## 2022-01-13 ENCOUNTER — Encounter (HOSPITAL_COMMUNITY): Payer: Self-pay | Admitting: *Deleted

## 2022-01-13 ENCOUNTER — Emergency Department (HOSPITAL_COMMUNITY)
Admission: EM | Admit: 2022-01-13 | Discharge: 2022-01-13 | Disposition: A | Discharge status: Home / Self Care | Payer: Commercial Managed Care - PPO | Attending: Emergency Medicine | Admitting: Emergency Medicine

## 2022-01-13 ENCOUNTER — Other Ambulatory Visit: Payer: Self-pay

## 2022-01-13 DIAGNOSIS — M5431 Sciatica, right side: Secondary | ICD-10-CM

## 2022-01-13 DIAGNOSIS — M545 Low back pain, unspecified: Secondary | ICD-10-CM | POA: Diagnosis present

## 2022-01-13 DIAGNOSIS — W010XXA Fall on same level from slipping, tripping and stumbling without subsequent striking against object, initial encounter: Secondary | ICD-10-CM | POA: Insufficient documentation

## 2022-01-13 MED ORDER — PREDNISONE 20 MG PO TABS: 40.0000 mg | ORAL_TABLET | Freq: Every day | ORAL | 0 refills | Status: DC

## 2022-01-13 NOTE — ED Provider Notes (Signed)
[facility] EMERGENCY DEPARTMENT Provider Note   CSN: [id number] Arrival date & time: 01/13/22  1529     History  Chief Complaint  Patient presents with   Back Pain    Tonya Burns is a 31 y.o. female.   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  It does not cause any numbness or weakness and she is still able to ambulate but with some discomfort.  She has been taking over-the-counter medications including aspirin containing products, ibuprofen and Tylenol without significant relief.  She denies any abdominal pain fevers or chills    Home Medications Prior to Admission medications   Medication Sig Start Date End Date Taking? Authorizing Provider  predniSONE (DELTASONE) 20 MG tablet Take 2 tablets (40 mg total) by mouth daily. 01/13/22  Yes Eber Hong, MD  acyclovir (ZOVIRAX) 400 MG tablet Take 1 tablet (400 mg total) by mouth 2 (two) times daily. 12/27/21   Cheral Marker, CNM  medroxyPROGESTERone (PROVERA) 10 MG tablet Take 1 tablet (10 mg total) by mouth daily. Every 2-3 months if no period. Let us know if you don't bleed after taking pills 12/27/21   Cheral Marker, CNM      Allergies    Tomato    Review of Systems   Review of Systems  Musculoskeletal:  Positive for back pain.  Neurological:  Negative for weakness and numbness.    Physical Exam Updated Vital Signs BP 124/79 (BP Location: Right Arm)   Pulse 84   Temp 98.4 F (36.9 C) (Oral)   Resp 14   Ht 1.575 m (5\' 2" )   Wt 78 kg   LMP 01/07/2022   SpO2 100%   BMI 31.46 kg/m  Physical  Exam Constitutional:      General: She is not in acute distress.    Appearance: She is well-developed. She is not diaphoretic.  HENT:     Head: Normocephalic and atraumatic.  Eyes:     General: No scleral icterus.       Right eye: No discharge.        Left eye: No discharge.     Conjunctiva/sclera: Conjunctivae normal.  Cardiovascular:     Rate and Rhythm: Normal rate and regular rhythm.  Pulmonary:     Effort: Pulmonary effort is normal.     Breath sounds: Normal breath sounds.  Musculoskeletal:     Comments: Tenderness of the back over the bilateral lumbar area and buttocks No tenderness over the Cervical, Thoracic or Lumbar Spine  Skin:    General: Skin is warm and dry.     Findings: No rash.  Neurological:     Comments: Speech is clear, strength in the UE and LE's are normal at the major muscle groups including the hip, knee and ankles.  Sensation in tact to light touch and pin prick of the bilateral LE's.  Normal reflexes at the knees bilaterally.  Gait antalgic secondary to pain      ED Results / Procedures / Treatments   Labs (all labs  ordered are listed, but only abnormal results are displayed) Labs Reviewed - No data to display  EKG None  Radiology No results found.  Procedures Procedures    Medications Ordered in ED Medications - No data to display  ED Course/ Medical Decision Making/ A&P                           Medical Decision Making  Exam is unremarkable, the patient likely has sciatica based on history and complaints however there is no focal neurologic findings and she is low risk for pathological back pain.  Vital signs reviewed and unremarkable.  I discussed with the patient that she can continue the over-the-counter medications but will add a steroid as well, she will need follow-up closely with her family doctor and is agreeable, she understands indications for return.        Final Clinical Impression(s) / ED Diagnoses Final diagnoses:   Bilateral sciatica    Rx / DC Orders ED Discharge Orders          Ordered    predniSONE (DELTASONE) 20 MG tablet  Daily        01/13/22 1756              Noemi Chapel, MD 01/13/22 1757

## 2022-01-13 NOTE — Discharge Instructions (Signed)
Back Pain:   Your back pain should be treated with medicines such as ibuprofen or aleve and this back pain should get better over the next 2 weeks.  However if you develop severe or worsening pain, low back pain with fever, numbness, weakness or inability to walk or urinate, you should return to the ER immediately.  Please follow up with your doctor this week for a recheck if still having symptoms. Low back pain is discomfort in the lower back that may be due to injuries to muscles and ligaments around the spine.  Occasionally, it may be caused by a a problem to a part of the spine called a disc.  The pain may last several days or a week;  However, most patients get completely well in 4 weeks.  Self - care:  The application of heat can help soothe the pain.  Maintaining your daily activities, including walking, is encourged, as it will help you get better faster than just staying in bed.  Medications are also useful to help with pain control.  A commonly prescribed medications includes acetaminophen.  This medication is generally safe, though you should not take more than 8 of the extra strength (500mg ) pills a day.  Non steroidal anti inflammatory medications including Ibuprofen and naproxen;  These medications help both pain and swelling and are very useful in treating back pain.  They should be taken with food, as they can cause stomach upset, and more seriously, stomach bleeding.    Muscle relaxants:  These medications can help with muscle tightness that is a cause of lower back pain.  Most of these medications can cause drowsiness, and it is not safe to drive or use dangerous machinery while taking them.  You will need to follow up with  Your primary healthcare provider in 1-2 weeks for reassessment.  Be aware that if you develop new symptoms, such as a fever, leg weakness, difficulty with or loss of control of your urine or bowels, abdominal pain, or more severe pain, you will need to seek  medical attention and  / or return to the Emergency department.      Prednisone is a steroid that helps to reduce certain types of inflammation and may be used for allergic reactions, some rashes such as poison ivy or dermatitis, for asthma attacks or bronchitis and for certain types of pain.  Please take this medicine exactly as prescribed - 40mg  by mouth daily for 5 days.  This can have certain side effects with some people including feeling like you can't sleep, feeling anxious or feeling like you are on a "high".  It should not cause weight gain if only taken for a short time.  Please be aware that this medication may also cause an elevation in your blood sugar if you are a diabetic so if you are a diabetic you will need to keep a very close eye on your blood sugar, make sure that you are eating an extremely low level of carbohydrates and taking your medications exactly as prescribed.  If you should develop severe high blood sugar or start to feel poorly return to the emergency department immediately

## 2022-01-13 NOTE — ED Triage Notes (Signed)
Pt with lower back pain that radiates down both legs.  Pt was working Monday and had a patient close to falling and pt had patient and placed when in wheelchair.

## 2022-02-20 ENCOUNTER — Other Ambulatory Visit: Payer: Self-pay | Admitting: Adult Health

## 2022-02-20 DIAGNOSIS — N926 Irregular menstruation, unspecified: Secondary | ICD-10-CM

## 2022-02-20 NOTE — Progress Notes (Signed)
Ck QHCG  

## 2022-02-22 ENCOUNTER — Other Ambulatory Visit: Payer: Self-pay | Admitting: Women's Health

## 2022-02-22 LAB — BETA HCG QUANT (REF LAB): hCG Quant: <1 m[IU]/mL

## 2022-02-23 ENCOUNTER — Other Ambulatory Visit: Payer: Self-pay | Admitting: Adult Health

## 2022-02-23 MED ORDER — VALACYCLOVIR HCL 1 G PO TABS: 1000.0000 mg | ORAL_TABLET | Freq: Every day | ORAL | 3 refills | Status: DC

## 2022-02-23 NOTE — Progress Notes (Signed)
Rx: valtrex. 

## 2022-04-09 ENCOUNTER — Emergency Department (HOSPITAL_COMMUNITY)
Admission: EM | Admit: 2022-04-09 | Discharge: 2022-04-09 | Disposition: A | Discharge status: Home / Self Care | Payer: Commercial Managed Care - PPO | Attending: Emergency Medicine | Admitting: Emergency Medicine

## 2022-04-09 ENCOUNTER — Encounter (HOSPITAL_COMMUNITY): Payer: Self-pay | Admitting: *Deleted

## 2022-04-09 ENCOUNTER — Other Ambulatory Visit: Payer: Self-pay

## 2022-04-09 DIAGNOSIS — J111 Influenza due to unidentified influenza virus with other respiratory manifestations: Secondary | ICD-10-CM

## 2022-04-09 DIAGNOSIS — J101 Influenza due to other identified influenza virus with other respiratory manifestations: Secondary | ICD-10-CM | POA: Diagnosis not present

## 2022-04-09 DIAGNOSIS — M791 Myalgia, unspecified site: Secondary | ICD-10-CM | POA: Diagnosis present

## 2022-04-09 DIAGNOSIS — Z1152 Encounter for screening for COVID-19: Secondary | ICD-10-CM | POA: Insufficient documentation

## 2022-04-09 LAB — RESP PANEL BY RT-PCR (RSV, FLU A&B, COVID)  RVPGX2
Influenza A by PCR: NEGATIVE
Influenza B by PCR: POSITIVE — AB
Resp Syncytial Virus by PCR: NEGATIVE
SARS Coronavirus 2 by RT PCR: NEGATIVE

## 2022-04-09 MED ORDER — ONDANSETRON HCL 4 MG PO TABS: 4.0000 mg | ORAL_TABLET | Freq: Four times a day (QID) | ORAL | 0 refills | Status: AC

## 2022-04-09 NOTE — Discharge Instructions (Addendum)
You were seen for influenza in the emergency department.   At home, please take Tylenol and ibuprofen for your symptoms.  You may also take over-the-counter cough medicine or tea with honey for your cough.  You may take the Zofran we have prescribed you for any nausea or vomiting you may have.  Check your MyChart online for the results of any tests that had not resulted by the time you left the emergency department.   Follow-up with your primary doctor in 2-3 days regarding your visit.    Return immediately to the emergency department if you experience any of the following: Difficulty breathing, or any other concerning symptoms.    Thank you for visiting our Emergency Department. It was a pleasure taking care of you today.

## 2022-04-09 NOTE — ED Triage Notes (Signed)
Pt states her son was diagnosed with flu x one week ago and she has been having fever with cough and generalized body aches

## 2022-04-09 NOTE — ED Provider Notes (Signed)
[facility] EMERGENCY DEPARTMENT Provider Note   CSN: [id number] Arrival date & time: 04/09/22  0626     History  Chief Complaint  Patient presents with   Generalized Body Aches    Tonya Burns is a 31 y.o. female.  31 year old female previously healthy who presents to the emergency department with headache, congestion, and cough.  States that 2 weeks ago her son was sick with similar symptoms.  Was diagnosed with a viral URI.  Says that during that timeframe she also developed the same symptoms.  Also is having mild sore throat.  Has been trying NSAIDs and TheraFlu at home for symptoms.  Had a temperature of 101F last night.  Had to leave work because she was sick.  Denies any significant shortness of breath or chest pain.  Also did have an episode of nausea and vomiting but is not reporting abdominal pain.       Home Medications Prior to Admission medications   Medication Sig Start Date End Date Taking? Authorizing Provider  ondansetron (ZOFRAN) 4 MG tablet Take 1 tablet (4 mg total) by mouth every 6 (six) hours. 04/09/22  Yes Rondel Baton, MD  medroxyPROGESTERone (PROVERA) 10 MG tablet Take 1 tablet (10 mg total) by mouth daily. Every 2-3 months if no period. Let us know if you don't bleed after taking pills 12/27/21   Cheral Marker, CNM  predniSONE (DELTASONE) 20 MG tablet Take 2 tablets (40 mg total) by mouth daily. 01/13/22   Eber Hong, MD  valACYclovir (VALTREX) 1000 MG tablet TAKE 1 TABLET BY MOUTH EVERY DAY 02/27/22   Cheral Marker, CNM  valACYclovir (VALTREX) 1000 MG tablet Take 1 tablet (1,000 mg total) by mouth daily. 02/23/22   Adline Potter, NP      Allergies    Tomato    Review of Systems   Review of Systems  Physical Exam Updated Vital Signs BP 107/67   Pulse 80   Temp 98.6 F (37 C) (Oral)   Resp 18   Ht 5\' 2"  (1.575 m)   Wt 77.1 kg   LMP 02/08/2022   SpO2 99%   BMI 31.09 kg/m  Physical Exam Vitals and nursing note  reviewed.  Constitutional:      General: She is not in acute distress.    Appearance: She is well-developed.  HENT:     Head: Normocephalic and atraumatic.     Right Ear: External ear normal.     Left Ear: External ear normal.     Nose: Nose normal.     Mouth/Throat:     Mouth: Mucous membranes are moist.     Pharynx: Oropharynx is clear. No oropharyngeal exudate or posterior oropharyngeal erythema.  Eyes:     Extraocular Movements: Extraocular movements intact.     Conjunctiva/sclera: Conjunctivae normal.     Pupils: Pupils are equal, round, and reactive to light.  Cardiovascular:     Rate and Rhythm: Normal rate and regular rhythm.     Heart sounds: No murmur heard. Pulmonary:     Effort: Pulmonary effort is normal. No respiratory distress.     Breath sounds: Normal breath sounds.  Abdominal:     General: There is no distension.     Palpations: There is no mass.     Tenderness: There is no abdominal tenderness. There is no guarding.  Musculoskeletal:     Cervical back: Normal range of motion and neck supple.     Right lower leg: No  edema.     Left lower leg: No edema.  Skin:    General: Skin is warm and dry.  Neurological:     Mental Status: She is alert and oriented to person, place, and time. Mental status is at baseline.  Psychiatric:        Mood and Affect: Mood normal.     ED Results / Procedures / Treatments   Labs (all labs ordered are listed, but only abnormal results are displayed) Labs Reviewed  RESP PANEL BY RT-PCR (RSV, FLU A&B, COVID)  RVPGX2 - Abnormal; Notable for the following components:      Result Value   Influenza B by PCR POSITIVE (*)    All other components within normal limits    EKG None  Radiology No results found.  Procedures Procedures   Medications Ordered in ED Medications - No data to display  ED Course/ Medical Decision Making/ A&P                           Medical Decision Making Risk Prescription drug  management.   Tonya Burns is a 31 y.o. female who presents with URI symptoms  Initial Ddx:  URI, pneumonia   MDM:  Feel the patient likely has a URI based on their symptoms.  They are overall well-appearing so do not feel that chest x-ray is warranted.    Plan:  COVID/flu  ED Summary/Re-evaluation:  Patient reevaluated in the emergency department and was stable.  She is found to have influenza.  Based on timeline do not feel that she would benefit from Tamiflu.  Feel that they are suitable for outpatient workup at this time so we will have them follow-up with their primary doctor in 2 to 3 days.  With her nausea and vomiting was given a prescription for Zofran.  This patient presents to the ED for concern of complaints listed in HPI, this involves an extensive number of treatment options, and is a complaint that carries with it a high risk of complications and morbidity. Disposition including potential need for admission considered.   Dispo: DC Home. Return precautions discussed including, but not limited to, those listed in the AVS. Allowed pt time to ask questions which were answered fully prior to dc.  Records reviewed Outpatient Clinic Notes I personally reviewed and interpreted cardiac monitoring: normal sinus rhythm  I have reviewed the patients home medications and made adjustments as needed  Final Clinical Impression(s) / ED Diagnoses Final diagnoses:  Influenza    Rx / DC Orders ED Discharge Orders          Ordered    ondansetron (ZOFRAN) 4 MG tablet  Every 6 hours        04/09/22 1005              Rondel Baton, MD 04/09/22 2100

## 2022-04-12 ENCOUNTER — Other Ambulatory Visit: Payer: Self-pay | Admitting: Adult Health

## 2022-05-02 ENCOUNTER — Other Ambulatory Visit: Payer: Self-pay | Admitting: Adult Health

## 2022-06-30 ENCOUNTER — Emergency Department (HOSPITAL_COMMUNITY)
Admission: EM | Admit: 2022-06-30 | Discharge: 2022-06-30 | Disposition: A | Discharge status: Home / Self Care | Payer: Self-pay | Attending: Emergency Medicine | Admitting: Emergency Medicine

## 2022-06-30 ENCOUNTER — Other Ambulatory Visit: Payer: Self-pay

## 2022-06-30 DIAGNOSIS — Z20822 Contact with and (suspected) exposure to covid-19: Secondary | ICD-10-CM | POA: Insufficient documentation

## 2022-06-30 DIAGNOSIS — J028 Acute pharyngitis due to other specified organisms: Secondary | ICD-10-CM | POA: Insufficient documentation

## 2022-06-30 DIAGNOSIS — J029 Acute pharyngitis, unspecified: Secondary | ICD-10-CM

## 2022-06-30 DIAGNOSIS — B9789 Other viral agents as the cause of diseases classified elsewhere: Secondary | ICD-10-CM | POA: Insufficient documentation

## 2022-06-30 LAB — RESP PANEL BY RT-PCR (RSV, FLU A&B, COVID)  RVPGX2
Influenza A by PCR: NEGATIVE
Influenza B by PCR: NEGATIVE
Resp Syncytial Virus by PCR: NEGATIVE
SARS Coronavirus 2 by RT PCR: NEGATIVE

## 2022-06-30 LAB — GROUP A STREP BY PCR: Group A Strep by PCR: NOT DETECTED

## 2022-06-30 MED ORDER — IBUPROFEN 800 MG PO TABS
800.0000 mg | ORAL_TABLET | Freq: Once | ORAL | Status: AC
  Administered 2022-06-30: 800 mg via ORAL
  Filled 2022-06-30: qty 1

## 2022-06-30 NOTE — ED Notes (Signed)
Patient Alert and oriented to baseline. Stable and ambulatory to baseline. Patient verbalized understanding of the discharge instructions.  Patient belongings were taken by the patient.   

## 2022-06-30 NOTE — ED Provider Notes (Signed)
[facility] Provider Note   CSN: [id number] Arrival date & time: 06/30/22  [id number]     History  Chief Complaint  Patient presents with   Sore Throat    Tonya Burns is a 32 y.o. female presenting with a 1 week history of sore throat along with nasal congestion, rhinorrhea, nonproductive cough.  Her throat is the worst symptom, describing significant pain with attempts to swallow.  She noted this morning white spots on the back of her throat.  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  She denies shortness of breath, abdominal pain, nausea or vomiting.  She has taken various OTC cough and cold remedies in addition to ibuprofen for her throat pain with transient improvement in pain symptoms.  The history is provided by the patient.       Home Medications Prior to Admission medications   Medication Sig Start Date End Date Taking? Authorizing Provider  medroxyPROGESTERone (PROVERA) 10 MG tablet Take 1 tablet (10 mg total) by mouth daily. Every 2-3 months if no period. Let us know if you don't bleed after taking pills 12/27/21   Roma Schanz, CNM  ondansetron (ZOFRAN) 4 MG tablet Take 1 tablet (4 mg total) by mouth every 6 (six) hours. 04/09/22   Fransico Meadow, MD  predniSONE (DELTASONE) 20 MG tablet Take 2 tablets (40 mg total) by mouth daily. 01/13/22   Noemi Chapel, MD  valACYclovir (VALTREX) 1000 MG tablet TAKE 1 TABLET BY MOUTH EVERY DAY 02/27/22   Roma Schanz, CNM  valACYclovir (VALTREX) 1000 MG tablet Take 1 tablet (1,000 mg total) by mouth daily. 02/23/22   Estill Dooms, NP      Allergies    Tomato    Review of Systems   Review of Systems  Constitutional:  Negative for chills and fever.  HENT:  Positive for congestion, rhinorrhea and sore throat. Negative for ear pain, sinus pressure, trouble swallowing and voice change.   Eyes:  Negative for  discharge.  Respiratory:  Positive for cough. Negative for shortness of breath, wheezing and stridor.   Cardiovascular:  Negative for chest pain.  Gastrointestinal:  Negative for abdominal pain.  Genitourinary: Negative.   All other systems reviewed and are negative.   Physical Exam Updated Vital Signs BP 118/84   Pulse 87   Temp 98.2 F (36.8 C)   Resp 18   Ht 5\' 2"  (1.575 m)   Wt 77.1 kg   SpO2 100%   BMI 31.09 kg/m  Physical Exam Constitutional:      Appearance: She is well-developed.  HENT:     Head: Normocephalic and atraumatic.     Right Ear: Tympanic membrane normal.     Left Ear: Tympanic membrane normal.     Nose: No mucosal edema.     Mouth/Throat:     Mouth: Mucous membranes are moist.     Pharynx: Uvula midline. Oropharyngeal exudate and posterior oropharyngeal erythema present. No uvula swelling.     Tonsils: No tonsillar abscesses.  Eyes:     Conjunctiva/sclera: Conjunctivae normal.  Cardiovascular:     Rate and Rhythm: Normal rate.     Heart sounds: Normal heart sounds.  Pulmonary:     Effort: Pulmonary effort is normal. No respiratory distress.     Breath sounds: No wheezing, rhonchi or rales.  Musculoskeletal:        General: Normal  range of motion.  Lymphadenopathy:     Cervical: No cervical adenopathy.  Skin:    General: Skin is warm and dry.     Findings: No rash.  Neurological:     Mental Status: She is alert and oriented to person, place, and time.     ED Results / Procedures / Treatments   Labs (all labs ordered are listed, but only abnormal results are displayed) Labs Reviewed  GROUP A STREP BY PCR  RESP PANEL BY RT-PCR (RSV, FLU A&B, COVID)  [id number]    EKG None  Radiology No results found.  Procedures Procedures    Medications Ordered in ED Medications  ibuprofen (ADVIL) tablet 800 mg (800 mg Oral Given 06/30/22 [id number])    ED Course/ Medical Decision Making/ A&P                             Medical Decision  Making Patient presenting with URI type symptoms including some congestion but with significant pharyngitis, nonproductive cough.  She has no exam findings to suggest peritonsillar abscess, she does have a few scattered white patches on her tonsils, her strep today is negative, also her COVID and flu tests are negative.  I suspect this is a viral pharyngitis.  Given instructions for home treatment, she may continue her ibuprofen for her sore throat pain as well.  Amount and/or Complexity of Data Reviewed Labs: ordered.    Details: Strep negative, COVID and influenza negative.  RSV negative.  Risk OTC drugs.           Final Clinical Impression(s) / ED Diagnoses Final diagnoses:  Viral pharyngitis    Rx / DC Orders ED Discharge Orders     None         Landis Martins 06/30/22 1044    Milton Ferguson, MD 06/30/22 1754

## 2022-06-30 NOTE — Discharge Instructions (Addendum)
Your, COVID and flu test are negative today.  I suspect your symptoms are secondary to a viral infection which should run its course with time.  Continue to treat your symptoms as you are doing.  Get rechecked for any new or worsening symptoms.

## 2022-06-30 NOTE — ED Triage Notes (Signed)
Patient c/o sore throat and congestion for one week. Patient handles secretions and speaks in full sentences.

## 2022-08-23 ENCOUNTER — Other Ambulatory Visit: Payer: Self-pay | Admitting: Adult Health

## 2022-08-23 MED ORDER — MEGESTROL ACETATE 40 MG PO TABS: ORAL_TABLET | ORAL | 0 refills | Status: DC

## 2022-08-23 NOTE — Progress Notes (Signed)
Rx megace??  

## 2022-09-09 DIAGNOSIS — Z419 Encounter for procedure for purposes other than remedying health state, unspecified: Secondary | ICD-10-CM | POA: Diagnosis not present

## 2022-09-14 ENCOUNTER — Other Ambulatory Visit: Payer: Self-pay | Admitting: Adult Health

## 2022-10-09 DIAGNOSIS — Z419 Encounter for procedure for purposes other than remedying health state, unspecified: Secondary | ICD-10-CM | POA: Diagnosis not present

## 2022-10-10 ENCOUNTER — Ambulatory Visit: Payer: Medicaid Other | Admitting: Adult Health

## 2022-11-09 DIAGNOSIS — Z419 Encounter for procedure for purposes other than remedying health state, unspecified: Secondary | ICD-10-CM | POA: Diagnosis not present

## 2022-12-10 DIAGNOSIS — Z419 Encounter for procedure for purposes other than remedying health state, unspecified: Secondary | ICD-10-CM | POA: Diagnosis not present

## 2023-01-02 ENCOUNTER — Ambulatory Visit: Payer: Medicaid Other | Admitting: Adult Health

## 2023-01-09 DIAGNOSIS — Z419 Encounter for procedure for purposes other than remedying health state, unspecified: Secondary | ICD-10-CM | POA: Diagnosis not present

## 2023-02-06 ENCOUNTER — Encounter: Payer: Self-pay | Admitting: Adult Health

## 2023-02-06 ENCOUNTER — Ambulatory Visit: Payer: Medicaid Other | Admitting: Adult Health

## 2023-02-06 ENCOUNTER — Other Ambulatory Visit (HOSPITAL_COMMUNITY)
Admission: RE | Admit: 2023-02-06 | Discharge: 2023-02-06 | Disposition: A | Discharge status: Home / Self Care | Payer: Medicaid Other | Source: Ambulatory Visit | Attending: Adult Health | Admitting: Adult Health

## 2023-02-06 VITALS — BP 114/72 | HR 76 | Ht 62.0 in | Wt 187.5 lb

## 2023-02-06 DIAGNOSIS — Z0000 Encounter for general adult medical examination without abnormal findings: Secondary | ICD-10-CM | POA: Diagnosis not present

## 2023-02-06 DIAGNOSIS — Z1151 Encounter for screening for human papillomavirus (HPV): Secondary | ICD-10-CM | POA: Diagnosis not present

## 2023-02-06 DIAGNOSIS — Z13220 Encounter for screening for lipoid disorders: Secondary | ICD-10-CM

## 2023-02-06 DIAGNOSIS — R635 Abnormal weight gain: Secondary | ICD-10-CM

## 2023-02-06 DIAGNOSIS — Z319 Encounter for procreative management, unspecified: Secondary | ICD-10-CM | POA: Diagnosis not present

## 2023-02-06 DIAGNOSIS — Z1339 Encounter for screening examination for other mental health and behavioral disorders: Secondary | ICD-10-CM | POA: Diagnosis not present

## 2023-02-06 DIAGNOSIS — N926 Irregular menstruation, unspecified: Secondary | ICD-10-CM | POA: Diagnosis not present

## 2023-02-06 DIAGNOSIS — Z01419 Encounter for gynecological examination (general) (routine) without abnormal findings: Secondary | ICD-10-CM | POA: Diagnosis not present

## 2023-02-06 DIAGNOSIS — L680 Hirsutism: Secondary | ICD-10-CM

## 2023-02-06 DIAGNOSIS — Z131 Encounter for screening for diabetes mellitus: Secondary | ICD-10-CM

## 2023-02-06 DIAGNOSIS — Z3202 Encounter for pregnancy test, result negative: Secondary | ICD-10-CM | POA: Insufficient documentation

## 2023-02-06 HISTORY — DX: Encounter for procreative management, unspecified: Z31.9

## 2023-02-06 LAB — POCT URINE PREGNANCY: Preg Test, Ur: NEGATIVE

## 2023-02-06 NOTE — Progress Notes (Signed)
Patient ID: Tonya Burns, female   DOB: 05/07/1990, 32 y.o.   MRN: [id number] History of Present Illness: Tonya Burns is a 32 year old black female,single, G2P0201, in for a well woman gyn exam and pap. She is complaining of irregular periods, increased facial hair and hair around nipples, wonders if PCOS. Has gained weight since starting nursing school.   PCP is Dr Tonya Burns    Current Medications, Allergies, Past Medical History, Past Surgical History, Family History and Social History were reviewed in [facility] electronic medical record.     Review of Systems: Patient denies any headaches, hearing loss, fatigue, blurred vision, shortness of breath, chest pain, abdominal pain, problems with bowel movements, urination, or intercourse. No joint pain or mood swings.  See HPI for positives.   Physical Exam:BP 114/72 (BP Location: Left Arm, Patient Position: Sitting, Cuff Size: Normal)   Pulse 76   Ht 5\' 2"  (1.575 m)   Wt 187 lb 8 oz (85 kg)   LMP 12/27/2022 (Approximate)   BMI 34.29 kg/m  UPT is negative  General:  Well developed, well nourished, no acute distress Skin:  Warm and dry, has hair unde chin(she waxes) Neck:  Midline trachea, normal thyroid, good ROM, no lymphadenopathy Lungs; Clear to auscultation bilaterally Breast:  No dominant palpable mass, retraction, or nipple discharge, has hair around areola Cardiovascular: Regular rate and rhythm Abdomen:  Soft, non tender, no hepatosplenomegaly Pelvic:  External genitalia is normal in appearance, no lesions.  The vagina is normal in appearance. Urethra has no lesions or masses. The cervix is smooth, pap with HR HPV genotyping performed.  Uterus is felt to be normal size, shape, and contour.  No adnexal masses or tenderness noted.Bladder is non tender, no masses felt. Extremities/musculoskeletal:  No swelling or varicosities noted, no clubbing or cyanosis Psych:  No mood changes, alert and cooperative,seems happy AA is 5 Fall risk  is low    02/06/2023    8:34 AM 12/27/2021    8:34 AM 03/31/2019    2:07 PM  Depression screen PHQ 2/9  Decreased Interest 0 0 0  Down, Depressed, Hopeless 0 0 0  PHQ - 2 Score 0 0 0  Altered sleeping 0 0   Tired, decreased energy 0 0   Change in appetite 0 0   Feeling bad or failure about yourself  2 0   Trouble concentrating 0 0   Moving slowly or fidgety/restless 0 0   Suicidal thoughts 0 0   PHQ-9 Score 2 0        02/06/2023    8:34 AM 12/27/2021    8:34 AM  GAD 7 : Generalized Anxiety Score  Nervous, Anxious, on Edge 0 0  Control/stop worrying 1 0  Worry too much - different things 1 0  Trouble relaxing 0 0  Restless 0 0  Easily annoyed or irritable 0 0  Afraid - awful might happen 0 0  Total GAD 7 Score 2 0      [facility] - 02/06/23 0840       Pregnancy Intention Screening   Does the patient want to become pregnant in the next year? Yes    Does the patient's partner want to become pregnant in the next year? Yes    Would the patient like to discuss contraceptive options today? No      Contraception Wrap Up   Current Method Pregnant/Seeking Pregnancy    End Method Pregnant/Seeking Pregnancy  Examination chaperoned by Malachy Mood LPN  Impression and Plan: 1. Pregnancy examination or test, negative result - POCT urine pregnancy  2. Routine general medical examination at a health care facility Pap sent Pap in 3 years if normal Physical in 1 year Will check labs  - Cytology - PAP( [facility]) - CBC - Comprehensive metabolic panel - Lipid panel  3. Encounter for routine gynecological examination with Papanicolaou smear of cervix Pap sent Physical in 1 year  4. Irregular periods Periods not regular, ?PCO, will check labs and get [place] in office in about 2 weeks to assess ovaries - US PELVIC COMPLETE WITH TRANSVAGINAL; Future - TSH + free T4 - Insulin, random  5. Patient desires pregnancy Has been trying over a year, son is 31 Start OTC  PNV and decrease alcohol   6. Hirsutism Has increased hair under chin and around areola - Testosterone,Free and Total  7. Weight gain Has gained weight since starting nursing school  - TSH + free T4  8. Screening cholesterol level - Lipid panel  9. Screening for diabetes mellitus - Hemoglobin A1c

## 2023-02-09 ENCOUNTER — Other Ambulatory Visit: Payer: Self-pay | Admitting: Adult Health

## 2023-02-09 DIAGNOSIS — Z419 Encounter for procedure for purposes other than remedying health state, unspecified: Secondary | ICD-10-CM | POA: Diagnosis not present

## 2023-02-09 LAB — CYTOLOGY - PAP
Adequacy: ABSENT
Comment: NEGATIVE
Diagnosis: NEGATIVE
High risk HPV: NEGATIVE

## 2023-02-09 LAB — COMPREHENSIVE METABOLIC PANEL
ALT: 16 [IU]/L (ref 0–32)
AST: 18 [IU]/L (ref 0–40)
Albumin: 4.5 g/dL (ref 3.9–4.9)
Alkaline Phosphatase: 56 [IU]/L (ref 44–121)
BUN/Creatinine Ratio: 16 (ref 9–23)
BUN: 14 mg/dL (ref 6–20)
Bilirubin Total: 0.3 mg/dL (ref 0.0–1.2)
CO2: 22 mmol/L (ref 20–29)
Calcium: 9.5 mg/dL (ref 8.7–10.2)
Chloride: 105 mmol/L (ref 96–106)
Creatinine, Ser: 0.86 mg/dL (ref 0.57–1.00)
Globulin, Total: 2.8 g/dL (ref 1.5–4.5)
Glucose: 103 mg/dL — ABNORMAL HIGH (ref 70–99)
Potassium: 4.3 mmol/L (ref 3.5–5.2)
Sodium: 144 mmol/L (ref 134–144)
Total Protein: 7.3 g/dL (ref 6.0–8.5)
eGFR: 93 mL/min/{1.73_m2} (ref >59)

## 2023-02-09 LAB — TSH+FREE T4
Free T4: 1.05 ng/dL (ref 0.82–1.77)
TSH: 1.31 u[IU]/mL (ref 0.450–4.500)

## 2023-02-09 LAB — HEMOGLOBIN A1C
Est. average glucose Bld gHb Est-mCnc: 117 mg/dL
Hgb A1c MFr Bld: 5.7 % — ABNORMAL HIGH (ref 4.8–5.6)

## 2023-02-09 LAB — LIPID PANEL
Chol/HDL Ratio: 2.8 ratio (ref 0.0–4.4)
Cholesterol, Total: 187 mg/dL (ref 100–199)
HDL: 68 mg/dL (ref >39)
LDL Chol Calc (NIH): 92 mg/dL (ref 0–99)
Triglycerides: 158 mg/dL — ABNORMAL HIGH (ref 0–149)
VLDL Cholesterol Cal: 27 mg/dL (ref 5–40)

## 2023-02-09 LAB — CBC
Hematocrit: 37.4 % (ref 34.0–46.6)
Hemoglobin: 12.3 g/dL (ref 11.1–15.9)
MCH: 29.8 pg (ref 26.6–33.0)
MCHC: 32.9 g/dL (ref 31.5–35.7)
MCV: 91 fL (ref 79–97)
Platelets: 240 10*3/uL (ref 150–450)
RBC: 4.13 x10E6/uL (ref 3.77–5.28)
RDW: 12.8 % (ref 11.7–15.4)
WBC: 6.7 10*3/uL (ref 3.4–10.8)

## 2023-02-09 LAB — INSULIN, RANDOM: INSULIN: 37.8 u[IU]/mL — ABNORMAL HIGH (ref 2.6–24.9)

## 2023-02-09 LAB — TESTOSTERONE,FREE AND TOTAL
Testosterone, Free: 4.5 pg/mL — ABNORMAL HIGH (ref 0.0–4.2)
Testosterone: 56 ng/dL (ref 8–60)

## 2023-02-09 MED ORDER — METRONIDAZOLE 500 MG PO TABS: 500.0000 mg | ORAL_TABLET | Freq: Two times a day (BID) | ORAL | 0 refills | Status: DC

## 2023-02-09 NOTE — Progress Notes (Signed)
+  BV on pap will rx flagyl  

## 2023-02-12 ENCOUNTER — Ambulatory Visit: Payer: Medicaid Other | Admitting: Radiology

## 2023-02-12 DIAGNOSIS — N926 Irregular menstruation, unspecified: Secondary | ICD-10-CM | POA: Diagnosis not present

## 2023-02-12 NOTE — Progress Notes (Signed)
LMP 12-27-22  Hx Oligomenorrhea TA and TV images performed  -  Chaperone: Tish Anteverted Uterus, normal in size, symmetrical homogeneous myometrium, no focal mass seen ECC = 4.73mm  no evidence of intracavitary defects,  avascular cavity and canal The ovaries are normal in size but have appearance of PCOS with numerous  small follicles all <= 6 mm and follicles are mostly peripheral in location. Neg adnexal regions -  Neg CDS  -  No Free Fluid present

## 2023-02-19 ENCOUNTER — Ambulatory Visit (INDEPENDENT_AMBULATORY_CARE_PROVIDER_SITE_OTHER): Payer: Medicaid Other | Admitting: Adult Health

## 2023-02-19 ENCOUNTER — Encounter: Payer: Self-pay | Admitting: Adult Health

## 2023-02-19 VITALS — BP 130/87 | HR 89 | Ht 62.0 in | Wt 183.0 lb

## 2023-02-19 DIAGNOSIS — E282 Polycystic ovarian syndrome: Secondary | ICD-10-CM | POA: Diagnosis not present

## 2023-02-19 DIAGNOSIS — Z319 Encounter for procreative management, unspecified: Secondary | ICD-10-CM | POA: Diagnosis not present

## 2023-02-19 DIAGNOSIS — N926 Irregular menstruation, unspecified: Secondary | ICD-10-CM

## 2023-02-19 NOTE — Progress Notes (Signed)
  Subjective:     Patient ID: Tonya Burns, female   DOB: Apr 02, 1991, 32 y.o.   MRN: [id number]  HPI Tonya Burns is a 32 year old black female, single, G2P0201 in to review [place] results.      Component Value Date/Time   DIAGPAP  02/06/2023 0909    - Negative for intraepithelial lesion or malignancy (NILM)   DIAGPAP  07/07/2020 0928    - Negative for Intraepithelial Lesions or Malignancy (NILM)   DIAGPAP - Benign reactive/reparative changes 07/07/2020 0928   HPVHIGH Negative 02/06/2023 0909   HPVHIGH Negative 07/07/2020 0928   HPVHIGH Positive (A) 03/31/2019 1430   ADEQPAP  02/06/2023 0909    Satisfactory for evaluation; transformation zone component ABSENT.   ADEQPAP  07/07/2020 0928    Satisfactory for evaluation; transformation zone component PRESENT.   ADEQPAP  03/31/2019 1430    Satisfactory for evaluation; transformation zone component PRESENT.   PCP is Dr Tanya Nones  Review of Systems Periods irregular  LM: about 12/27/22 Reviewed past medical,surgical, social and family history. Reviewed medications and allergies.     Objective:   Physical Exam BP 130/87 (BP Location: Right Arm, Patient Position: Sitting, Cuff Size: Normal)   Pulse 89   Ht 5\' 2"  (1.575 m)   Wt 183 lb (83 kg)   LMP 12/27/2022 (Approximate)   BMI 33.47 kg/m     Skin warm and dry. Lungs: clear to ausculation bilaterally. Cardiovascular: regular rate and rhythm.  Reviewed [place] with her. [place] normal, ovaries have numerous small follicles, like PCOS  Upstream - 02/19/23 1502       Pregnancy Intention Screening   Does the patient want to become pregnant in the next year? Yes    Does the patient's partner want to become pregnant in the next year? Yes    Would the patient like to discuss contraceptive options today? No      Contraception Wrap Up   Current Method Pregnant/Seeking Pregnancy    End Method Pregnant/Seeking Pregnancy    Contraception Counseling Provided No             Assessment:     1.  Irregular periods Last period in September   2. PCOS (polycystic ovarian syndrome) US showed numerous small follicles in both ovaries   3. Patient desires pregnancy Call with next period, will check progesterone level day 21 Try to lose 15-20 lbs     Plan:     Follow up prn

## 2023-02-26 ENCOUNTER — Ambulatory Visit (INDEPENDENT_AMBULATORY_CARE_PROVIDER_SITE_OTHER): Payer: Medicaid Other | Admitting: *Deleted

## 2023-02-26 ENCOUNTER — Other Ambulatory Visit (HOSPITAL_COMMUNITY)
Admission: RE | Admit: 2023-02-26 | Discharge: 2023-02-26 | Disposition: A | Discharge status: Home / Self Care | Payer: Medicaid Other | Source: Ambulatory Visit | Attending: Obstetrics & Gynecology | Admitting: Obstetrics & Gynecology

## 2023-02-26 DIAGNOSIS — N898 Other specified noninflammatory disorders of vagina: Secondary | ICD-10-CM | POA: Diagnosis not present

## 2023-02-26 NOTE — Progress Notes (Signed)
   NURSE VISIT- VAGINITIS/STD/POC  SUBJECTIVE:  Tonya Burns is a 32 y.o. [id number] GYN patientfemale here for a vaginal swab for vaginitis screening.  She reports the following symptoms: discharge described as pink that started 3 days ago, local irritation, and odor.  Completed medication for BV but then developed a yeast infection in which she took Monistat 3 and symptoms resolved slightly but still having some irritation.  Denies abnormal vaginal bleeding, significant pelvic pain, fever, or UTI symptoms.  OBJECTIVE:  LMP 12/27/2022 (Approximate)   Appears well, in no apparent distress  ASSESSMENT: Vaginal swab for vaginitis screening  PLAN: Self-collected vaginal probe for Gonorrhea, Chlamydia, Trichomonas, Bacterial Vaginosis, Yeast sent to lab Treatment: to be determined once results are received Follow-up as needed if symptoms persist/worsen, or new symptoms develop  Jobe Marker  02/26/2023 2:45 PM

## 2023-02-28 LAB — CERVICOVAGINAL ANCILLARY ONLY
Bacterial Vaginitis (gardnerella): NEGATIVE
Candida Glabrata: NEGATIVE
Candida Vaginitis: NEGATIVE
Chlamydia: NEGATIVE
Comment: NEGATIVE
Comment: NEGATIVE
Comment: NEGATIVE
Comment: NEGATIVE
Comment: NEGATIVE
Comment: NORMAL
Neisseria Gonorrhea: NEGATIVE
Trichomonas: NEGATIVE

## 2023-03-11 DIAGNOSIS — Z419 Encounter for procedure for purposes other than remedying health state, unspecified: Secondary | ICD-10-CM | POA: Diagnosis not present

## 2023-03-20 ENCOUNTER — Other Ambulatory Visit: Payer: Self-pay | Admitting: Adult Health

## 2023-03-26 ENCOUNTER — Other Ambulatory Visit: Payer: Self-pay | Admitting: Adult Health

## 2023-03-26 DIAGNOSIS — Z319 Encounter for procreative management, unspecified: Secondary | ICD-10-CM

## 2023-04-11 DIAGNOSIS — Z419 Encounter for procedure for purposes other than remedying health state, unspecified: Secondary | ICD-10-CM | POA: Diagnosis not present

## 2023-04-16 DIAGNOSIS — Z319 Encounter for procreative management, unspecified: Secondary | ICD-10-CM | POA: Diagnosis not present

## 2023-04-17 LAB — PROGESTERONE: Progesterone: 0.2 ng/mL

## 2023-04-23 ENCOUNTER — Other Ambulatory Visit: Payer: Self-pay | Admitting: Adult Health

## 2023-04-23 MED ORDER — CLOMIPHENE CITRATE 50 MG PO TABS: ORAL_TABLET | ORAL | 2 refills | Status: AC

## 2023-04-23 NOTE — Progress Notes (Signed)
Rx clomid 

## 2023-04-26 ENCOUNTER — Other Ambulatory Visit: Payer: Self-pay | Admitting: Adult Health

## 2023-04-26 DIAGNOSIS — Z319 Encounter for procreative management, unspecified: Secondary | ICD-10-CM

## 2023-04-26 NOTE — Progress Notes (Signed)
Ck progesterone level 05/16/23

## 2023-05-12 DIAGNOSIS — Z419 Encounter for procedure for purposes other than remedying health state, unspecified: Secondary | ICD-10-CM | POA: Diagnosis not present

## 2023-05-15 ENCOUNTER — Encounter: Payer: Self-pay | Admitting: Family Medicine

## 2023-05-15 ENCOUNTER — Ambulatory Visit (INDEPENDENT_AMBULATORY_CARE_PROVIDER_SITE_OTHER): Payer: Medicaid Other | Admitting: Family Medicine

## 2023-05-15 VITALS — BP 120/88 | HR 80 | Temp 98.7°F | Ht 62.0 in | Wt 194.0 lb

## 2023-05-15 DIAGNOSIS — E669 Obesity, unspecified: Secondary | ICD-10-CM | POA: Insufficient documentation

## 2023-05-15 DIAGNOSIS — Z0001 Encounter for general adult medical examination with abnormal findings: Secondary | ICD-10-CM

## 2023-05-15 DIAGNOSIS — R7303 Prediabetes: Secondary | ICD-10-CM | POA: Insufficient documentation

## 2023-05-15 DIAGNOSIS — Z1159 Encounter for screening for other viral diseases: Secondary | ICD-10-CM | POA: Diagnosis not present

## 2023-05-15 DIAGNOSIS — Z0000 Encounter for general adult medical examination without abnormal findings: Secondary | ICD-10-CM | POA: Insufficient documentation

## 2023-05-15 NOTE — Assessment & Plan Note (Signed)
Last A1c 5.7%. Counseled on importance of heart healthy diet and exercise.

## 2023-05-15 NOTE — Assessment & Plan Note (Signed)
 Counseled on importance of weight management for overall health. Encouraged low calorie, heart healthy diet and moderate intensity exercise 150 minutes weekly. This is 3-5 times weekly for 30-50 minutes each session. Goal should be pace of 3 miles/hours, or walking 1.5 miles in 30 minutes and include strength training. Discussed risks of obesity. Will refer to MWM.

## 2023-05-15 NOTE — Progress Notes (Signed)
 New Patient Office Visit  Subjective    Patient ID: Tonya Burns, female    DOB: September 24, 1990  Age: 33 y.o. MRN: [id number]  CC:  Chief Complaint  Patient presents with   Follow-up    Annual physical   Establish Care    HPI Tonya Burns presents to establish care. Oriented to practice routines and expectations. Has not been seeing PCP in years. PMH includes PCOS, HSV2, and prediabetes seen regularly by OBGYN. Concerns include weight management. Has tried intermittent fasting and calorie counting, weight watchers, currently goal 1400 calories per day. Currently eats a regular diet. Walks a lot at work, she is a psychologist, sport and exercise at [employer].   Cervical CA screening: approximate date 03/2023 and was normal Tobacco: non-smoker DUP:izropwzd Vaccines:  UTD    Outpatient Encounter Medications as of 05/15/2023  Medication Sig   clomiPHENE  (CLOMID ) 50 MG tablet Take 1 on days 5-9 of cycle.   valACYclovir  (VALTREX ) 1000 MG tablet TAKE 1 TABLET BY MOUTH EVERY DAY   ondansetron  (ZOFRAN ) 4 MG tablet Take 1 tablet (4 mg total) by mouth every 6 (six) hours. (Patient not taking: Reported on 05/15/2023)   No facility-administered encounter medications on file as of 05/15/2023.    Past Medical History:  Diagnosis Date   Abnormal Papanicolaou smear of cervix with positive human papilloma virus (HPV) test 04/09/2019   03/2019 pap was ASCUS with +HPV, pap in 2017 was LSIL, and she die not get colpo, will schedule colpo with Dr Jayne    Patient desires pregnancy 02/06/2023   PCOS (polycystic ovarian syndrome)    Prediabetes    Pregnant    Pregnant 12/30/2012   Vaginal Pap smear, abnormal     Past Surgical History:  Procedure Laterality Date   NO PAST SURGERIES      Family History  Problem Relation Age of Onset   Hypertension Father    Preterm labor Son     Social History   Socioeconomic History   Marital status: Single    Spouse name: Not on file   Number of children: 1   Years of  education: Not on file   Highest education level: Some college, no degree  Occupational History   Not on file  Tobacco Use   Smoking status: Former    Types: Cigarettes   Smokeless tobacco: Never  Vaping Use   Vaping status: Never Used  Substance and Sexual Activity   Alcohol use: Yes    Alcohol/week: 5.0 standard drinks of alcohol    Types: 5 Shots of liquor per week    Comment: occassionally/5 shots of liquor on Friday   Drug use: No   Sexual activity: Yes    Birth control/protection: None  Other Topics Concern   Not on file  Social History Narrative   Not on file   Social Drivers of Health   Financial Resource Strain: Medium Risk (05/14/2023)   Overall Financial Resource Strain (CARDIA)    Difficulty of Paying Living Expenses: Somewhat hard  Food Insecurity: Food Insecurity Present (05/14/2023)   Hunger Vital Sign    Worried About Running Out of Food in the Last Year: Never true    Ran Out of Food in the Last Year: Sometimes true  Transportation Needs: No Transportation Needs (05/14/2023)   PRAPARE - Administrator, Civil Service (Medical): No    Lack of Transportation (Non-Medical): No  Physical Activity: Insufficiently Active (05/14/2023)   Exercise Vital Sign  Days of Exercise per Week: 2 days    Minutes of Exercise per Session: 20 min  Stress: No Stress Concern Present (05/14/2023)   [employer] of Occupational Health - Occupational Stress Questionnaire    Feeling of Stress : Not at all  Social Connections: Moderately Isolated (05/14/2023)   Social Connection and Isolation Panel [NHANES]    Frequency of Communication with Friends and Family: More than three times a week    Frequency of Social Gatherings with Friends and Family: Once a week    Attends Religious Services: More than 4 times per year    Active Member of [employer] or Organizations: No    Attends Banker Meetings: Never    Marital Status: Divorced  Catering Manager Violence: Not At  Risk (02/06/2023)   Humiliation, Afraid, Rape, and Kick questionnaire    Fear of Current or Ex-Partner: No    Emotionally Abused: No    Physically Abused: No    Sexually Abused: No    Review of Systems  Constitutional: Negative.   HENT: Negative.    Eyes: Negative.   Respiratory: Negative.    Cardiovascular: Negative.   Gastrointestinal: Negative.   Genitourinary: Negative.   Musculoskeletal: Negative.   Skin: Negative.   Neurological: Negative.   Endo/Heme/Allergies: Negative.   Psychiatric/Behavioral: Negative.    All other systems reviewed and are negative.       Objective    BP 120/88   Pulse 80   Temp 98.7 F (37.1 C) (Oral)   Ht 5' 2 (1.575 m)   Wt 194 lb (88 kg)   LMP 03/26/2023 (Approximate)   SpO2 98%   BMI 35.48 kg/m   Physical Exam Vitals and nursing note reviewed.  Constitutional:      Appearance: Normal appearance. She is obese.  HENT:     Head: Normocephalic and atraumatic.     Right Ear: Tympanic membrane, ear canal and external ear normal.     Left Ear: Tympanic membrane, ear canal and external ear normal.     Nose: Nose normal.     Mouth/Throat:     Mouth: Mucous membranes are moist.     Pharynx: Oropharynx is clear.  Eyes:     Extraocular Movements: Extraocular movements intact.     Conjunctiva/sclera: Conjunctivae normal.     Pupils: Pupils are equal, round, and reactive to light.  Cardiovascular:     Rate and Rhythm: Normal rate and regular rhythm.     Pulses: Normal pulses.     Heart sounds: Normal heart sounds.  Pulmonary:     Effort: Pulmonary effort is normal.     Breath sounds: Normal breath sounds.  Abdominal:     General: Bowel sounds are normal.     Palpations: Abdomen is soft.  Musculoskeletal:        General: Normal range of motion.     Cervical back: Normal range of motion and neck supple.  Skin:    General: Skin is warm and dry.     Capillary Refill: Capillary refill takes less than 2 seconds.  Neurological:      General: No focal deficit present.     Mental Status: She is alert and oriented to person, place, and time. Mental status is at baseline.  Psychiatric:        Mood and Affect: Mood normal.        Behavior: Behavior normal.        Thought Content: Thought content normal.  Judgment: Judgment normal.         Assessment & Plan:   Problem List Items Addressed This Visit     Obesity (BMI 35.0-39.9 without comorbidity)   Counseled on importance of weight management for overall health. Encouraged low calorie, heart healthy diet and moderate intensity exercise 150 minutes weekly. This is 3-5 times weekly for 30-50 minutes each session. Goal should be pace of 3 miles/hours, or walking 1.5 miles in 30 minutes and include strength training. Discussed risks of obesity. Will refer to MWM.       Relevant Orders   Amb Ref to Medical Weight Management   Prediabetes   Last A1c 5.7%. Counseled on importance of heart healthy diet and exercise.      Relevant Orders   Amb Ref to Medical Weight Management   Hemoglobin A1c   Physical exam, annual - Primary   Today your medical history was reviewed and routine physical exam was performed. Recent labs reviewed. Recommend 150 minutes of moderate intensity exercise weekly and consuming a well-balanced diet. Advised to stop smoking if a smoker, avoid smoking if a non-smoker, limit alcohol consumption to 1 drink per day for women and 2 drinks per day for men, and avoid illicit drug use. Counseled on safe sex practices and offered STI testing today. Counseled on the importance of sunscreen use. Counseled in mental health awareness and when to seek medical care. Vaccine maintenance discussed. Appropriate health maintenance items reviewed. Return to office in 1 year for annual physical exam.       Other Visit Diagnoses       Need for hepatitis C screening test       Relevant Orders   Hepatitis C antibody       Return in about 1 year (around  05/14/2024) for annual physical with labs 1 week prior.   Jeoffrey GORMAN Barrio, FNP

## 2023-05-15 NOTE — Assessment & Plan Note (Addendum)
 Today your medical history was reviewed and routine physical exam was performed. Recent labs reviewed. Recommend 150 minutes of moderate intensity exercise weekly and consuming a well-balanced diet. Advised to stop smoking if a smoker, avoid smoking if a non-smoker, limit alcohol consumption to 1 drink per day for women and 2 drinks per day for men, and avoid illicit drug use. Counseled on safe sex practices and offered STI testing today. Counseled on the importance of sunscreen use. Counseled in mental health awareness and when to seek medical care. Vaccine maintenance discussed. Appropriate health maintenance items reviewed. Return to office in 1 year for annual physical exam.

## 2023-06-09 DIAGNOSIS — Z419 Encounter for procedure for purposes other than remedying health state, unspecified: Secondary | ICD-10-CM | POA: Diagnosis not present

## 2023-06-14 ENCOUNTER — Encounter (INDEPENDENT_AMBULATORY_CARE_PROVIDER_SITE_OTHER): Payer: Medicaid Other | Admitting: Family Medicine

## 2023-06-21 ENCOUNTER — Encounter (INDEPENDENT_AMBULATORY_CARE_PROVIDER_SITE_OTHER): Payer: Self-pay

## 2023-07-21 DIAGNOSIS — Z419 Encounter for procedure for purposes other than remedying health state, unspecified: Secondary | ICD-10-CM | POA: Diagnosis not present

## 2023-08-20 DIAGNOSIS — Z419 Encounter for procedure for purposes other than remedying health state, unspecified: Secondary | ICD-10-CM | POA: Diagnosis not present

## 2023-09-20 DIAGNOSIS — Z419 Encounter for procedure for purposes other than remedying health state, unspecified: Secondary | ICD-10-CM | POA: Diagnosis not present

## 2023-10-20 DIAGNOSIS — Z419 Encounter for procedure for purposes other than remedying health state, unspecified: Secondary | ICD-10-CM | POA: Diagnosis not present

## 2023-11-20 DIAGNOSIS — Z419 Encounter for procedure for purposes other than remedying health state, unspecified: Secondary | ICD-10-CM | POA: Diagnosis not present

## 2023-12-21 DIAGNOSIS — Z419 Encounter for procedure for purposes other than remedying health state, unspecified: Secondary | ICD-10-CM | POA: Diagnosis not present

## 2023-12-28 NOTE — Telephone Encounter (Signed)
 NA

## 2024-02-11 ENCOUNTER — Ambulatory Visit: Admitting: Adult Health

## 2024-04-18 ENCOUNTER — Ambulatory Visit: Payer: Self-pay | Admitting: Family Medicine

## 2024-04-28 ENCOUNTER — Ambulatory Visit: Admitting: Adult Health

## 2024-05-05 ENCOUNTER — Ambulatory Visit: Admitting: Adult Health

## 2024-05-07 ENCOUNTER — Ambulatory Visit: Admitting: Adult Health

## 2024-05-08 ENCOUNTER — Ambulatory Visit: Admitting: Adult Health

## 2024-05-12 ENCOUNTER — Ambulatory Visit: Admitting: Adult Health

## 2024-05-13 ENCOUNTER — Ambulatory Visit: Admitting: Adult Health

## 2024-05-15 ENCOUNTER — Other Ambulatory Visit: Payer: Self-pay | Admitting: Adult Health

## 2024-05-19 ENCOUNTER — Ambulatory Visit: Admitting: Adult Health

## 2024-07-10 ENCOUNTER — Ambulatory Visit: Admitting: Adult Health
